# Patient Record
Sex: Male | Born: 1935 | Race: White | Hispanic: No | Marital: Married | Smoking: Former smoker
Health system: Southern US, Community
[De-identification: ages and names within clinical notes are randomized; demographics above are authoritative.]

## PROBLEM LIST (undated history)

## (undated) DIAGNOSIS — E7800 Pure hypercholesterolemia, unspecified: Secondary | ICD-10-CM

## (undated) DIAGNOSIS — G4700 Insomnia, unspecified: Secondary | ICD-10-CM

## (undated) DIAGNOSIS — N400 Enlarged prostate without lower urinary tract symptoms: Secondary | ICD-10-CM

## (undated) DIAGNOSIS — J329 Chronic sinusitis, unspecified: Secondary | ICD-10-CM

## (undated) DIAGNOSIS — M5030 Other cervical disc degeneration, unspecified cervical region: Secondary | ICD-10-CM

## (undated) DIAGNOSIS — I2510 Atherosclerotic heart disease of native coronary artery without angina pectoris: Secondary | ICD-10-CM

## (undated) DIAGNOSIS — J3800 Paralysis of vocal cords and larynx, unspecified: Secondary | ICD-10-CM

## (undated) DIAGNOSIS — J449 Chronic obstructive pulmonary disease, unspecified: Secondary | ICD-10-CM

## (undated) HISTORY — DX: Chronic sinusitis, unspecified: J32.9

## (undated) HISTORY — DX: Pure hypercholesterolemia, unspecified: E78.00

## (undated) HISTORY — DX: Insomnia, unspecified: G47.00

## (undated) HISTORY — DX: Benign prostatic hyperplasia without lower urinary tract symptoms: N40.0

## (undated) HISTORY — DX: Other cervical disc degeneration, unspecified cervical region: M50.30

## (undated) HISTORY — DX: Atherosclerotic heart disease of native coronary artery without angina pectoris: I25.10

## (undated) HISTORY — DX: Chronic obstructive pulmonary disease, unspecified: J44.9

## (undated) HISTORY — DX: Paralysis of vocal cords and larynx, unspecified: J38.00

---

## 2007-03-16 ENCOUNTER — Ambulatory Visit: Payer: Self-pay | Admitting: Surgery

## 2008-03-07 ENCOUNTER — Ambulatory Visit: Payer: Self-pay | Admitting: Surgery

## 2008-03-07 ENCOUNTER — Encounter: Admission: RE | Admit: 2008-03-07 | Discharge: 2008-03-07 | Payer: Self-pay | Admitting: Surgery

## 2009-02-27 ENCOUNTER — Ambulatory Visit: Payer: Self-pay | Admitting: Surgery

## 2009-02-27 ENCOUNTER — Encounter: Admission: RE | Admit: 2009-02-27 | Discharge: 2009-02-27 | Payer: Self-pay | Admitting: Surgery

## 2011-02-13 ENCOUNTER — Other Ambulatory Visit: Payer: Self-pay | Admitting: Surgery

## 2011-02-13 DIAGNOSIS — I712 Thoracic aortic aneurysm, without rupture: Secondary | ICD-10-CM

## 2011-02-25 ENCOUNTER — Encounter (INDEPENDENT_AMBULATORY_CARE_PROVIDER_SITE_OTHER): Payer: Medicare Other | Admitting: Surgery

## 2011-02-25 ENCOUNTER — Ambulatory Visit
Admission: RE | Admit: 2011-02-25 | Discharge: 2011-02-25 | Disposition: A | Discharge status: Home / Self Care | Payer: Medicare Other | Source: Ambulatory Visit | Attending: Surgery | Admitting: Surgery

## 2011-02-25 DIAGNOSIS — I712 Thoracic aortic aneurysm, without rupture: Secondary | ICD-10-CM

## 2011-02-25 MED ORDER — GADOBENATE DIMEGLUMINE 529 MG/ML IV SOLN
13.0000 mL | Freq: Once | INTRAVENOUS | Status: AC | PRN
  Administered 2011-02-25: 13 mL via INTRAVENOUS

## 2011-02-27 NOTE — Assessment & Plan Note (Signed)
OFFICE VISIT  David Figueroa, David Figueroa DOB:  [birth year]                                        February 27, 2011 CHART #:  [id number]  The patient returned to my office for followup of an ascending aortic aneurysm.  I last saw him on February 27, 2009, at which he had a stable a fusiform ascending aortic aneurysm measuring 4.1 x 4.1 cm on CT scan. We decided to follow this up in 2 years.  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  He was told that there really was not much to do.  He said they did do an echocardiogram to rule out intracardiac thrombus.  PHYSICAL EXAMINATION:  Vital Signs:  Today, his blood pressure is 176/106 and a repeat was 168/104, pulse was 87 and regular, respiratory rate is 20 and unlabored.  Oxygen saturation in room air is 97%. General:  He looks well.  Cardiac:  Regular rate and rhythm with normal heart sounds.  There is no murmur, rub, or gallop.  Lungs:  Clear.  MR angiogram of the chest shows a stable 4.2-cm fusiform ascending aortic aneurysm that is unchanged from previous chest CT scan on February 27, 2009.  There was no evidence of aortic dissection.  There was mild atherosclerotic plaque in the descending thoracic aorta but no evidence of aneurysm.  There is a stable 2.3-cm thyroid nodule within the isthmus.  There were no other abnormalities seen.  IMPRESSION:  The patient has a stable 4.2-cm ascending aortic aneurysm. Given his age of 89, I think it is unlikely that he will require intervention for this aneurysm in the future.  I gave him the option of continuing to follow this with periodic MRI or to stop following it and he would like to continue following it for the time being.  I will plan to see him back in about 2 years for repeat MR angiogram of the thoracic  aorta.  Evelene Croon, M.D. Electronically Signed  BB/MEDQ  D:  02/27/2011  T:  02/27/2011  Job:  [id number]  cc:   Roney Marion, MD

## 2011-05-06 NOTE — Assessment & Plan Note (Signed)
OFFICE VISIT   FABIO, WAH  DOB:  04/18/1928                                        February 27, 2009  CHART #:  [id number]   The patient returns today for followup of an ascending aortic aneurysm.  I last saw him on March 07, 2008, at which time, CT scan of the chest  showed that it was still 4.1 x 4.1 cm and had not changed since his  initial CT scan in March 2008.  Over the past year, he has had no chest  pain or pressure.  His only complaints are related to his vision as well  as some difficulty with balance and decreased energy level.   PHYSICAL EXAMINATION:  VITAL SIGNS:  Today, his blood pressure 123/83,  his pulse is 100 and regular, respiratory rate is 18 and unlabored.  Oxygen saturation on room air is 96%.  GENERAL:  He looks well.  CARDIAC:  A regular rate and rhythm with a soft 1/6 high-pitched murmur  over the aorta.  There is no diastolic murmur.  LUNGS:  Clear.  EXTREMITIES:  There is no peripheral edema.   Followup CT scan of the chest today shows no change in the size of the  ascending aortic aneurysm measuring 4.1 x 4.1 cm.  There are no other  significant abnormalities on the CT scan.   IMPRESSION:  The patient has a 4.1-cm ascending aortic aneurysm that has  been stable since at least March 2008.  I told him that I think this is  unlikely to change significantly over the next  year and we could wait 2 years to do a MR angiogram.  He will not  receive any further radiation for that study.  I will plan to see him  back after that.   Evelene Croon, M.D.  Electronically Signed   BB/MEDQ  D:  02/27/2009  T:  02/27/2009  Job:  [id number]   cc:   Barbera Setters. Lawana Pai, MD

## 2011-05-06 NOTE — Assessment & Plan Note (Signed)
OFFICE VISIT   David Figueroa, David Figueroa  DOB:  08-28-1928                                        March 07, 2008  CHART #:  [id number]   HISTORY:  [name] returned to my office today for followup of an  ascending aortic aneurysm.  He is a 77 year old gentleman referred by  Dr. Lawana Pai.  I last saw him on March 16, 2007 for evaluation of his  aneurysm.  That was his initial CT scan of the chest which had been done  to rule out pulmonary embolism after he presented with some symptoms of  congestion, chest tightness and weakness.  At that time it did show a 4  x 4.1 mm ascending aortic aneurysm with the descending aorta measuring  2.5 cm.  I felt that the best treatment would be to do a followup scan  in about six months to reassess the size.  He said that he did have a  repeat scan performed on September 29, 2007 prior to clearance for some  shoulder surgery but did not return to my office.  Today, he reports  feeling poorly over the past day or so.  He said he felt like he was  getting the flu, although he did have the flu shot.  He has had some  chills and cough and his wife said he felt hot this morning, although  she said she took his temperature and it was normal.  He does report  some mild achiness as well as some pains in the right side of his back.   PHYSICAL EXAMINATION:  VITAL SIGNS:  His blood pressure is 115/71, his  pulse is 104 and regular.  Respiratory rate is 18 and unlabored.  Oxygen  saturation is 96%.  GENERAL:  He looks tired.  CARDIAC:  Exam shows a regular rate and rhythm with normal S1 and S2.  There is no murmur.  LUNGS:  His lungs sound clear.   CT scan of the chest done today shows no change in the size of his  ascending aortic aneurysm measuring 4.1 x 4.1 cm in maximum diameter.  The scan did show a right lower lobe infiltrate consistent with  developing pneumonia.   IMPRESSION AND PLANS:  The ascending aortic aneurysm is unchanged.   I  would recommend repeating his CT scan in one year.  He does have  evidence of right lower lobe pneumonia on CT scan of the chest and has  been feeling poorly over the past day or two.  I asked him to contact  Dr. Ardine Bjork office today so that he can be seen and treated for  possible pneumonia.  I called Dr. Lawana Pai on the phone and discussed this  with him and he said that he would be happy to see the patient later  today.  I will plan to see him back in one year.   Evelene Croon, M.D.  Electronically Signed   BB/MEDQ  D:  03/07/2008  T:  03/08/2008  Job:  [id number]   cc:   Dr. Roney Marion

## 2013-01-31 ENCOUNTER — Other Ambulatory Visit: Payer: Self-pay

## 2013-01-31 DIAGNOSIS — I712 Thoracic aortic aneurysm, without rupture: Secondary | ICD-10-CM

## 2013-03-14 ENCOUNTER — Other Ambulatory Visit: Payer: Medicare Other

## 2013-03-15 ENCOUNTER — Ambulatory Visit: Payer: Medicare Other | Admitting: Surgery

## 2013-03-16 ENCOUNTER — Ambulatory Visit: Payer: Medicare Other | Admitting: Surgery

## 2013-03-30 ENCOUNTER — Ambulatory Visit
Admission: RE | Admit: 2013-03-30 | Discharge: 2013-03-30 | Disposition: A | Discharge status: Home / Self Care | Payer: Medicare Other | Source: Ambulatory Visit | Attending: Surgery | Admitting: Surgery

## 2013-03-30 ENCOUNTER — Encounter: Payer: Self-pay | Admitting: *Deleted

## 2013-03-30 ENCOUNTER — Ambulatory Visit (INDEPENDENT_AMBULATORY_CARE_PROVIDER_SITE_OTHER): Payer: Medicare Other | Admitting: Surgery

## 2013-03-30 ENCOUNTER — Encounter: Payer: Self-pay | Admitting: Surgery

## 2013-03-30 VITALS — BP 156/96 | HR 83 | Resp 16 | Ht 71.0 in | Wt 162.0 lb

## 2013-03-30 DIAGNOSIS — Z712 Person consulting for explanation of examination or test findings: Secondary | ICD-10-CM

## 2013-03-30 DIAGNOSIS — I712 Thoracic aortic aneurysm, without rupture, unspecified: Secondary | ICD-10-CM

## 2013-03-30 DIAGNOSIS — Z7189 Other specified counseling: Secondary | ICD-10-CM

## 2013-03-30 HISTORY — DX: Thoracic aortic aneurysm, without rupture: I71.2

## 2013-03-30 HISTORY — DX: Thoracic aortic aneurysm, without rupture, unspecified: I71.20

## 2013-03-30 MED ORDER — GADOBENATE DIMEGLUMINE 529 MG/ML IV SOLN
15.0000 mL | Freq: Once | INTRAVENOUS | Status: AC | PRN
  Administered 2013-03-30: 15 mL via INTRAVENOUS

## 2013-03-30 NOTE — Progress Notes (Signed)
[address]       [place] [address]             [phone]      HPI:     The patient returns today for followup of an ascending aortic aneurysm.  I last saw him in 2012, at which time, CT scan of the chest  showed that it was still 4.2 cm and had not changed since his  initial CT scan in March 2008.  Over the past year, he has had no chest  pain or pressure.  His only complaints are related to his vision as well  as hearing loss.  BP 156/96  Pulse 83  Resp 16  Ht 5\' 11"  (1.803 m)  Wt 162 lb (73.483 kg)  BMI 22.6 kg/m2  SpO2 95%   GENERAL:  He looks well.  CARDIAC:  A regular rate and rhythm with a soft 1/6 high-pitched murmur  over the aorta.  There is no diastolic murmur.  LUNGS:  Clear.  EXTREMITIES:  There is no peripheral edema.      Current Outpatient Prescriptions  Medication Sig Dispense Refill  . aspirin 325 MG EC tablet Take 325 mg by mouth daily.      . citalopram (CELEXA) 10 MG tablet Take 10 mg by mouth daily.      Marland Kitchen dutasteride (AVODART) 0.5 MG capsule Take 0.5 mg by mouth daily.      . Fluticasone-Salmeterol (ADVAIR) 100-50 MCG/DOSE AEPB Inhale 1 puff into the lungs every 12 (twelve) hours.      . hydrocortisone (ANUSOL-HC) 2.5 % rectal cream Place rectally as needed for hemorrhoids.      . Multiple Vitamins-Minerals (MULTIVITAMIN PO) Take by mouth.      . oxazepam (SERAX) 15 MG capsule Take 15 mg by mouth at bedtime as needed for sleep or anxiety. One or two as needed hs      . pravastatin (PRAVACHOL) 40 MG tablet Take 40 mg by mouth daily.      Marland Kitchen saccharomyces boulardii (FLORASTOR) 250 MG capsule Take 250 mg by mouth 2 (two) times daily.      . vitamin E 400 UNIT capsule Take 400 Units by mouth daily.       No current facility-administered medications for this visit.    Diagnostic Tests:   *RADIOLOGY REPORT*   Clinical Data: Aortic aneurysm   MRA CHEST WITH OR WITHOUT CONTRAST   Technique: Angiographic images of the  chest were obtained using MRA technique without and with intravenous contrast.   Contrast: 15mL MULTIHANCE GADOBENATE DIMEGLUMINE 529 MG/ML IV SOLN   Comparison: 02/25/2011   Findings: Maximal diameters of the aorta at the sinus of Valsalva, sinotubular junction, and ascending aorta are 4.2 cm, 3.2 cm, and 4.2 cm respectively. No evidence of intramural hematoma or dissection.   The aorta is patent.  Great vessels are patent.  Right subclavian and common carotid arteries are patent.  Right vertebral artery is patent.  Left vertebral artery is very diminutive and grossly patent.  Visualized internal jugular veins are patent.   No abnormal mediastinal mass effect or pericardial effusion. Dependent atelectasis at the right lung base.  No pleural effusion.   Thyroid gland remains heterogeneous.   IMPRESSION: Stable mild aneurysmal dilatation of the ascending aorta and sinus of Valsalva.     Original Report Authenticated By: Jolaine Click, M.D.          Impression:  He has stable fusiform aneurysmal dilatation of  the ascending aorta with a maximum diameter of 4.2 cm. This has not changed significantly since his initial CT scan in 2008.  Plan:  He is 79 years old but in overall fairly good shape for his age so I have recommended doing a followup scan in about 2 years.

## 2013-07-18 DIAGNOSIS — H3560 Retinal hemorrhage, unspecified eye: Secondary | ICD-10-CM | POA: Insufficient documentation

## 2013-07-18 HISTORY — DX: Retinal hemorrhage, unspecified eye: H35.60

## 2014-03-09 ENCOUNTER — Other Ambulatory Visit: Payer: Self-pay

## 2014-04-19 ENCOUNTER — Ambulatory Visit: Payer: Medicare Other | Admitting: Surgery

## 2015-03-07 ENCOUNTER — Other Ambulatory Visit: Payer: Self-pay | Admitting: *Deleted

## 2015-03-07 DIAGNOSIS — I712 Thoracic aortic aneurysm, without rupture, unspecified: Secondary | ICD-10-CM

## 2015-04-04 ENCOUNTER — Ambulatory Visit (INDEPENDENT_AMBULATORY_CARE_PROVIDER_SITE_OTHER): Payer: Commercial Managed Care - HMO | Admitting: Surgery

## 2015-04-04 ENCOUNTER — Encounter: Payer: Self-pay | Admitting: Surgery

## 2015-04-04 ENCOUNTER — Ambulatory Visit
Admission: RE | Admit: 2015-04-04 | Discharge: 2015-04-04 | Disposition: A | Discharge status: Home / Self Care | Payer: Commercial Managed Care - HMO | Source: Ambulatory Visit | Attending: Surgery | Admitting: Surgery

## 2015-04-04 DIAGNOSIS — I712 Thoracic aortic aneurysm, without rupture, unspecified: Secondary | ICD-10-CM

## 2015-04-04 DIAGNOSIS — I7121 Aneurysm of the ascending aorta, without rupture: Secondary | ICD-10-CM

## 2015-04-04 MED ORDER — GADOBENATE DIMEGLUMINE 529 MG/ML IV SOLN
15.0000 mL | Freq: Once | INTRAVENOUS | Status: AC | PRN
  Administered 2015-04-04: 15 mL via INTRAVENOUS

## 2015-04-07 ENCOUNTER — Encounter: Payer: Self-pay | Admitting: Surgery

## 2015-04-07 NOTE — Progress Notes (Signed)
HPI:  The patient returns today for follow up of an ascending aortic aneurysm. I last saw him on 03/30/2013 and his MRA of the chest had not changed since 2008 showing a maximum diameter of 4.2 cm in the mid ascending aorta. He has been feeling well with no new medical problems. He denies chest or back pain.  Current Outpatient Prescriptions  Medication Sig Dispense Refill  . aspirin 325 MG EC tablet Take 325 mg by mouth daily.    . citalopram (CELEXA) 10 MG tablet Take 10 mg by mouth daily.    Marland Kitchen dutasteride (AVODART) 0.5 MG capsule Take 0.5 mg by mouth daily.    . Fluticasone-Salmeterol (ADVAIR) 100-50 MCG/DOSE AEPB Inhale 1 puff into the lungs every 12 (twelve) hours.    . hydrocortisone (ANUSOL-HC) 2.5 % rectal cream Place rectally as needed for hemorrhoids.    . Multiple Vitamins-Minerals (MULTIVITAMIN PO) Take by mouth.    . oxazepam (SERAX) 15 MG capsule Take 15 mg by mouth at bedtime as needed for sleep or anxiety. One or two as needed hs    . pravastatin (PRAVACHOL) 40 MG tablet Take 40 mg by mouth daily.    . vitamin E 400 UNIT capsule Take 400 Units by mouth daily.     No current facility-administered medications for this visit.     Physical Exam: BP 155/90 mmHg  Pulse 90  Resp 20  Ht 5\' 11"  (1.803 m)  Wt 156 lb (70.761 kg)  BMI 21.77 kg/m2  SpO2 95% He looks well Cardiac exam shows a regular rate and rhythm with a 1/6 murmur along the RSB. There is no diastolic murmur. Lungs are clear There is no peripheral edema  Diagnostic Tests:  CLINICAL DATA: 81 year old male with thoracic aortic aneurysm. Scheduled to year follow-up evaluation.  EXAM: MRA CHEST WITH OR WITHOUT CONTRAST  TECHNIQUE: Angiographic images of the chest were obtained using MRA technique without and with intravenous contrast.  CONTRAST: 6mL MULTIHANCE GADOBENATE DIMEGLUMINE 529 MG/ML IV SOLN  COMPARISON: Prior MRA chest 03/30/2013  FINDINGS: VASCULAR  Stable mild fusiform  aneurysmal dilatation of the tubular portion of the ascending thoracic aorta. The maximal transverse diameter is again noted at 4.2 cm. The aortic root also measures 4.2 cm at the sinuses of Valsalva. There is no evidence of effacement of the sino-tubular junction. No evidence of significant atherosclerotic plaque, stenosis or dissection. The visualized portions of the branch arteries demonstrate no evidence of significant stenosis. There is mild-to-moderate narrowing 1 cm beyond the origin of the left subclavian artery. Normal caliber pulmonary artery. No evidence of pulmonary embolus. The heart is within normal limits for size. No pericardial effusion.  NON VASCULAR  No focal signal abnormality or abnormal enhancement. The lungs are clear save for trace dependent atelectasis in the right lower lobe. Unremarkable esophagus, mediastinum and bony structures.  IMPRESSION: 1. Stable mild dilatation of the aortic root and tubular portion of the ascending thoracic aorta with a maximal diameter of 4.2 cm. No evidence of effacement of the sino-tubular junction. 2. 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  Signed,  Criselda Peaches, MD  [facility]  [facility]   Electronically Signed  By: Jacqulynn Cadet M.D.  On: 04/04/2015 14:16  Impression:  He has a stable 4.2 cm fusiform ascending aortic aneurysm that is unchanged since 2008. His blood pressure is a little high today but he says that he thinks  that is due to coming to the doctor. He checks it at home and it is usually lower. I have recommended checking the aneurysm again in 2 years to be sure that it does not significantly change since he is in good condition for his age and may be an operative candidate if there was a large change in size.  Plan:  I will see him in 2 years with an MRA of the  chest.   Gaye Pollack, MD [facility] [phone]

## 2015-06-04 ENCOUNTER — Encounter (INDEPENDENT_AMBULATORY_CARE_PROVIDER_SITE_OTHER): Payer: Commercial Managed Care - HMO | Admitting: Ophthalmology

## 2015-06-04 DIAGNOSIS — H34233 Retinal artery branch occlusion, bilateral: Secondary | ICD-10-CM | POA: Diagnosis not present

## 2015-06-04 DIAGNOSIS — H43813 Vitreous degeneration, bilateral: Secondary | ICD-10-CM

## 2015-06-04 DIAGNOSIS — H3531 Nonexudative age-related macular degeneration: Secondary | ICD-10-CM | POA: Diagnosis not present

## 2015-06-04 DIAGNOSIS — H2512 Age-related nuclear cataract, left eye: Secondary | ICD-10-CM | POA: Diagnosis not present

## 2017-01-05 DIAGNOSIS — H26491 Other secondary cataract, right eye: Secondary | ICD-10-CM | POA: Diagnosis not present

## 2017-01-05 DIAGNOSIS — H2512 Age-related nuclear cataract, left eye: Secondary | ICD-10-CM | POA: Diagnosis not present

## 2017-01-05 DIAGNOSIS — H35373 Puckering of macula, bilateral: Secondary | ICD-10-CM | POA: Diagnosis not present

## 2017-01-05 DIAGNOSIS — H34233 Retinal artery branch occlusion, bilateral: Secondary | ICD-10-CM | POA: Diagnosis not present

## 2017-03-03 ENCOUNTER — Other Ambulatory Visit: Payer: Self-pay | Admitting: *Deleted

## 2017-03-03 DIAGNOSIS — I712 Thoracic aortic aneurysm, without rupture, unspecified: Secondary | ICD-10-CM

## 2017-03-14 ENCOUNTER — Other Ambulatory Visit: Payer: Commercial Managed Care - HMO

## 2017-03-31 ENCOUNTER — Other Ambulatory Visit: Payer: Self-pay | Admitting: Surgery

## 2017-04-08 ENCOUNTER — Ambulatory Visit
Admission: RE | Admit: 2017-04-08 | Discharge: 2017-04-08 | Disposition: A | Discharge status: Home / Self Care | Payer: Medicare HMO | Source: Ambulatory Visit | Attending: Surgery | Admitting: Surgery

## 2017-04-08 ENCOUNTER — Encounter: Payer: Self-pay | Admitting: Surgery

## 2017-04-08 ENCOUNTER — Ambulatory Visit (INDEPENDENT_AMBULATORY_CARE_PROVIDER_SITE_OTHER): Payer: Medicare HMO | Admitting: Surgery

## 2017-04-08 VITALS — BP 148/88 | HR 88 | Resp 20 | Ht 71.0 in

## 2017-04-08 DIAGNOSIS — I712 Thoracic aortic aneurysm, without rupture, unspecified: Secondary | ICD-10-CM

## 2017-04-08 DIAGNOSIS — Z8679 Personal history of other diseases of the circulatory system: Secondary | ICD-10-CM | POA: Diagnosis not present

## 2017-04-08 MED ORDER — GADOBENATE DIMEGLUMINE 529 MG/ML IV SOLN
15.0000 mL | Freq: Once | INTRAVENOUS | Status: AC | PRN
  Administered 2017-04-08: 15 mL via INTRAVENOUS

## 2017-04-08 NOTE — Progress Notes (Signed)
HPI:  The patient returns today for follow up of an ascending aortic aneurysm. I last saw him on 04/07/2015 and his MRA of the chest had not changed since 2008 showing a maximum diameter of 4.2 cm in the mid ascending aorta. He reports having a couple strokes since I last saw him that have affected his vision.  He denies chest or back pain.  Current Outpatient Prescriptions  Medication Sig Dispense Refill  . aspirin 325 MG EC tablet Take 325 mg by mouth daily.    . citalopram (CELEXA) 10 MG tablet Take 10 mg by mouth daily.    . clopidogrel (PLAVIX) 75 MG tablet Take 75 mg by mouth daily.    Marland Kitchen dutasteride (AVODART) 0.5 MG capsule Take 0.5 mg by mouth daily.    . Fluticasone-Salmeterol (ADVAIR) 100-50 MCG/DOSE AEPB Inhale 1 puff into the lungs every 12 (twelve) hours.    . hydrocortisone (ANUSOL-HC) 2.5 % rectal cream Place rectally as needed for hemorrhoids.    . Multiple Vitamins-Minerals (MULTIVITAMIN PO) Take by mouth.    . oxazepam (SERAX) 15 MG capsule Take 15 mg by mouth at bedtime as needed for sleep or anxiety. One or two as needed hs    . pravastatin (PRAVACHOL) 40 MG tablet Take 40 mg by mouth daily.    . vitamin E 400 UNIT capsule Take 400 Units by mouth daily.     No current facility-administered medications for this visit.      Physical Exam:  BP (!) 148/88   Pulse 88   Resp 20   Ht 5\' 11"  (1.803 m)   SpO2 99% Comment: RA He looks well Cardiac exam shows a regular rate and rhythm with a 1/6 systolic murmur along the RSB. There is no diastolic murmur. Lungs are clear There is no peripheral edema   Diagnostic Tests:  CLINICAL DATA:  83 year old male with a history of thoracic aortic aneurysm  EXAM: MRA CHEST WITH OR WITHOUT CONTRAST  TECHNIQUE: Angiographic images of the chest were obtained using MRA technique without and with intravenous contrast.  CONTRAST:  72mL MULTIHANCE GADOBENATE DIMEGLUMINE 529 MG/ML IV SOLN  COMPARISON:  Multiple prior  study, most recent 04/04/2015, 03/30/2013, 02/25/2011, CT 03/27/2010  FINDINGS: VASCULAR  Diameter of the ascending aorta measured perpendicular to the flow channel, measures approximately 4.2 cm, relatively unchanged from the comparison MR. no dissection flap. No periaortic fluid.  Greatest diameter measured at the sino-tubular junction is 3.1 cm, measured on series 13. Diameter estimated at the annulus 2.7 cm (series 8).  Three vessel arch, with patency maintained of the branch vessels. Again, evidence of perhaps 50% narrowing of the proximal left subclavian artery.  Heart size unchanged with no pericardial fluid/thickening.  NON-VASCULAR  Chest wall unremarkable. Visualized lungs demonstrate no pathologic signal.  Clear central airways. No mediastinal adenopathy. Unremarkable appearance of the esophagus. Hiatal hernia partially visualized. Unremarkable appearance of the visualized spinal canal.  IMPRESSION: Re- demonstration of ascending aortic aneurysm which measures 4.2 cm on today's study, unchanged from the comparison.  Again the MRI demonstrates approximately 50% narrowing of the left subclavian artery just after the origin.  Signed,  Dulcy Fanny. Earleen Newport, DO  [facility]  [facility]   Electronically Signed   By: Corrie Mckusick D.O.   On: 04/08/2017 16:05   Impression:  He has a stable 4.2 cm fusiform ascending aortic aneurysm that is unchanged since 2008. He is 83 years old and this has been stable  for the past 10 years so I don't think it is necessary to continue doing CT or MR studies. I think the chance of this small aneurysm significantly enlarging to require surgery in his lifetime is very small. I personally reviewed and interpreted the MRA images and reviewed them with him and his wife. I discussed the importance of good BP control. All of their questions have been answered.  Plan:  He  will continue to follow up with Dr. Nicki Reaper. I will be happy to see him back if the need arises but have not scheduled any further imaging studies.    Gaye Pollack, MD [facility] [phone]

## 2017-04-14 DIAGNOSIS — Z6821 Body mass index (BMI) 21.0-21.9, adult: Secondary | ICD-10-CM | POA: Diagnosis not present

## 2017-04-14 DIAGNOSIS — K219 Gastro-esophageal reflux disease without esophagitis: Secondary | ICD-10-CM | POA: Diagnosis not present

## 2017-04-14 DIAGNOSIS — Z0000 Encounter for general adult medical examination without abnormal findings: Secondary | ICD-10-CM | POA: Diagnosis not present

## 2017-04-14 DIAGNOSIS — E7800 Pure hypercholesterolemia, unspecified: Secondary | ICD-10-CM | POA: Diagnosis not present

## 2017-04-14 DIAGNOSIS — Z79899 Other long term (current) drug therapy: Secondary | ICD-10-CM | POA: Diagnosis not present

## 2017-04-14 DIAGNOSIS — R5383 Other fatigue: Secondary | ICD-10-CM | POA: Diagnosis not present

## 2017-04-14 DIAGNOSIS — E559 Vitamin D deficiency, unspecified: Secondary | ICD-10-CM | POA: Diagnosis not present

## 2017-04-14 DIAGNOSIS — Z8673 Personal history of transient ischemic attack (TIA), and cerebral infarction without residual deficits: Secondary | ICD-10-CM | POA: Diagnosis not present

## 2017-04-14 DIAGNOSIS — E785 Hyperlipidemia, unspecified: Secondary | ICD-10-CM | POA: Diagnosis not present

## 2017-04-14 DIAGNOSIS — J40 Bronchitis, not specified as acute or chronic: Secondary | ICD-10-CM | POA: Diagnosis not present

## 2017-04-16 DIAGNOSIS — K219 Gastro-esophageal reflux disease without esophagitis: Secondary | ICD-10-CM | POA: Diagnosis not present

## 2017-04-16 DIAGNOSIS — G3184 Mild cognitive impairment, so stated: Secondary | ICD-10-CM | POA: Diagnosis not present

## 2017-04-16 DIAGNOSIS — K649 Unspecified hemorrhoids: Secondary | ICD-10-CM | POA: Diagnosis not present

## 2017-04-16 DIAGNOSIS — Z0000 Encounter for general adult medical examination without abnormal findings: Secondary | ICD-10-CM | POA: Diagnosis not present

## 2017-04-16 DIAGNOSIS — J449 Chronic obstructive pulmonary disease, unspecified: Secondary | ICD-10-CM | POA: Diagnosis not present

## 2017-04-16 DIAGNOSIS — Z79899 Other long term (current) drug therapy: Secondary | ICD-10-CM | POA: Diagnosis not present

## 2017-04-16 DIAGNOSIS — Z7951 Long term (current) use of inhaled steroids: Secondary | ICD-10-CM | POA: Diagnosis not present

## 2017-04-16 DIAGNOSIS — Z6820 Body mass index (BMI) 20.0-20.9, adult: Secondary | ICD-10-CM | POA: Diagnosis not present

## 2017-04-16 DIAGNOSIS — Z87891 Personal history of nicotine dependence: Secondary | ICD-10-CM | POA: Diagnosis not present

## 2017-04-16 DIAGNOSIS — E7800 Pure hypercholesterolemia, unspecified: Secondary | ICD-10-CM | POA: Diagnosis not present

## 2017-04-16 DIAGNOSIS — K588 Other irritable bowel syndrome: Secondary | ICD-10-CM | POA: Diagnosis not present

## 2017-04-16 DIAGNOSIS — Z7902 Long term (current) use of antithrombotics/antiplatelets: Secondary | ICD-10-CM | POA: Diagnosis not present

## 2017-04-16 DIAGNOSIS — Z8673 Personal history of transient ischemic attack (TIA), and cerebral infarction without residual deficits: Secondary | ICD-10-CM | POA: Diagnosis not present

## 2017-04-16 DIAGNOSIS — H938X9 Other specified disorders of ear, unspecified ear: Secondary | ICD-10-CM | POA: Diagnosis not present

## 2017-04-16 DIAGNOSIS — I10 Essential (primary) hypertension: Secondary | ICD-10-CM | POA: Diagnosis not present

## 2017-04-16 DIAGNOSIS — Z9181 History of falling: Secondary | ICD-10-CM | POA: Diagnosis not present

## 2017-04-16 DIAGNOSIS — G4700 Insomnia, unspecified: Secondary | ICD-10-CM | POA: Diagnosis not present

## 2017-04-22 DIAGNOSIS — L219 Seborrheic dermatitis, unspecified: Secondary | ICD-10-CM | POA: Diagnosis not present

## 2017-04-22 DIAGNOSIS — S81802A Unspecified open wound, left lower leg, initial encounter: Secondary | ICD-10-CM | POA: Diagnosis not present

## 2017-04-23 ENCOUNTER — Other Ambulatory Visit: Payer: Commercial Managed Care - HMO

## 2017-06-01 DIAGNOSIS — Z6821 Body mass index (BMI) 21.0-21.9, adult: Secondary | ICD-10-CM | POA: Diagnosis not present

## 2017-06-01 DIAGNOSIS — J209 Acute bronchitis, unspecified: Secondary | ICD-10-CM | POA: Diagnosis not present

## 2017-07-07 DIAGNOSIS — H35373 Puckering of macula, bilateral: Secondary | ICD-10-CM | POA: Diagnosis not present

## 2017-07-07 DIAGNOSIS — H34233 Retinal artery branch occlusion, bilateral: Secondary | ICD-10-CM | POA: Diagnosis not present

## 2017-10-21 DIAGNOSIS — R69 Illness, unspecified: Secondary | ICD-10-CM | POA: Diagnosis not present

## 2017-12-28 DIAGNOSIS — Z6820 Body mass index (BMI) 20.0-20.9, adult: Secondary | ICD-10-CM | POA: Diagnosis not present

## 2017-12-28 DIAGNOSIS — S2239XA Fracture of one rib, unspecified side, initial encounter for closed fracture: Secondary | ICD-10-CM | POA: Diagnosis not present

## 2018-01-06 DIAGNOSIS — H3411 Central retinal artery occlusion, right eye: Secondary | ICD-10-CM | POA: Diagnosis not present

## 2018-01-08 DIAGNOSIS — H3411 Central retinal artery occlusion, right eye: Secondary | ICD-10-CM | POA: Diagnosis not present

## 2018-01-14 DIAGNOSIS — I517 Cardiomegaly: Secondary | ICD-10-CM | POA: Diagnosis not present

## 2018-01-14 DIAGNOSIS — I6523 Occlusion and stenosis of bilateral carotid arteries: Secondary | ICD-10-CM | POA: Diagnosis not present

## 2018-01-14 DIAGNOSIS — H3411 Central retinal artery occlusion, right eye: Secondary | ICD-10-CM | POA: Diagnosis not present

## 2018-01-21 DIAGNOSIS — H538 Other visual disturbances: Secondary | ICD-10-CM | POA: Diagnosis not present

## 2018-01-21 DIAGNOSIS — H539 Unspecified visual disturbance: Secondary | ICD-10-CM | POA: Diagnosis not present

## 2018-01-25 DIAGNOSIS — J439 Emphysema, unspecified: Secondary | ICD-10-CM | POA: Diagnosis not present

## 2018-01-25 DIAGNOSIS — I700 Atherosclerosis of aorta: Secondary | ICD-10-CM | POA: Diagnosis not present

## 2018-01-25 DIAGNOSIS — G458 Other transient cerebral ischemic attacks and related syndromes: Secondary | ICD-10-CM | POA: Diagnosis not present

## 2018-01-25 DIAGNOSIS — I712 Thoracic aortic aneurysm, without rupture: Secondary | ICD-10-CM | POA: Diagnosis not present

## 2018-01-28 DIAGNOSIS — R002 Palpitations: Secondary | ICD-10-CM | POA: Diagnosis not present

## 2018-02-03 DIAGNOSIS — H40051 Ocular hypertension, right eye: Secondary | ICD-10-CM | POA: Diagnosis not present

## 2018-02-03 DIAGNOSIS — H4311 Vitreous hemorrhage, right eye: Secondary | ICD-10-CM | POA: Diagnosis not present

## 2018-02-03 DIAGNOSIS — H3411 Central retinal artery occlusion, right eye: Secondary | ICD-10-CM | POA: Diagnosis not present

## 2018-02-10 DIAGNOSIS — H40051 Ocular hypertension, right eye: Secondary | ICD-10-CM | POA: Diagnosis not present

## 2018-02-16 DIAGNOSIS — R0609 Other forms of dyspnea: Secondary | ICD-10-CM | POA: Diagnosis not present

## 2018-02-16 DIAGNOSIS — I472 Ventricular tachycardia: Secondary | ICD-10-CM | POA: Diagnosis not present

## 2018-02-17 DIAGNOSIS — R0609 Other forms of dyspnea: Secondary | ICD-10-CM | POA: Diagnosis not present

## 2018-02-18 DIAGNOSIS — E782 Mixed hyperlipidemia: Secondary | ICD-10-CM | POA: Insufficient documentation

## 2018-02-18 DIAGNOSIS — H4311 Vitreous hemorrhage, right eye: Secondary | ICD-10-CM | POA: Diagnosis not present

## 2018-02-18 DIAGNOSIS — Z8673 Personal history of transient ischemic attack (TIA), and cerebral infarction without residual deficits: Secondary | ICD-10-CM | POA: Insufficient documentation

## 2018-02-18 DIAGNOSIS — Z7982 Long term (current) use of aspirin: Secondary | ICD-10-CM

## 2018-02-18 HISTORY — DX: Mixed hyperlipidemia: E78.2

## 2018-02-18 HISTORY — DX: Long term (current) use of aspirin: Z79.82

## 2018-02-18 HISTORY — DX: Personal history of transient ischemic attack (TIA), and cerebral infarction without residual deficits: Z86.73

## 2018-02-19 DIAGNOSIS — R0600 Dyspnea, unspecified: Secondary | ICD-10-CM | POA: Insufficient documentation

## 2018-02-19 DIAGNOSIS — H3411 Central retinal artery occlusion, right eye: Secondary | ICD-10-CM | POA: Diagnosis not present

## 2018-02-19 DIAGNOSIS — Z789 Other specified health status: Secondary | ICD-10-CM

## 2018-02-19 DIAGNOSIS — I77819 Aortic ectasia, unspecified site: Secondary | ICD-10-CM | POA: Diagnosis not present

## 2018-02-19 DIAGNOSIS — J449 Chronic obstructive pulmonary disease, unspecified: Secondary | ICD-10-CM | POA: Diagnosis not present

## 2018-02-19 DIAGNOSIS — Z87891 Personal history of nicotine dependence: Secondary | ICD-10-CM | POA: Insufficient documentation

## 2018-02-19 DIAGNOSIS — R0609 Other forms of dyspnea: Secondary | ICD-10-CM

## 2018-02-19 DIAGNOSIS — Z7982 Long term (current) use of aspirin: Secondary | ICD-10-CM | POA: Diagnosis not present

## 2018-02-19 DIAGNOSIS — E782 Mixed hyperlipidemia: Secondary | ICD-10-CM | POA: Diagnosis not present

## 2018-02-19 DIAGNOSIS — I472 Ventricular tachycardia, unspecified: Secondary | ICD-10-CM | POA: Insufficient documentation

## 2018-02-19 DIAGNOSIS — I771 Stricture of artery: Secondary | ICD-10-CM | POA: Insufficient documentation

## 2018-02-19 DIAGNOSIS — R0602 Shortness of breath: Secondary | ICD-10-CM | POA: Diagnosis not present

## 2018-02-19 DIAGNOSIS — Z8673 Personal history of transient ischemic attack (TIA), and cerebral infarction without residual deficits: Secondary | ICD-10-CM | POA: Diagnosis not present

## 2018-02-19 HISTORY — DX: Ventricular tachycardia: I47.2

## 2018-02-19 HISTORY — DX: Dyspnea, unspecified: R06.00

## 2018-02-19 HISTORY — DX: Other specified health status: Z78.9

## 2018-02-19 HISTORY — DX: Stricture of artery: I77.1

## 2018-02-19 HISTORY — DX: Other forms of dyspnea: R06.09

## 2018-02-19 HISTORY — DX: Personal history of nicotine dependence: Z87.891

## 2018-02-19 HISTORY — DX: Ventricular tachycardia, unspecified: I47.20

## 2018-02-21 DIAGNOSIS — R001 Bradycardia, unspecified: Secondary | ICD-10-CM | POA: Diagnosis not present

## 2018-02-22 DIAGNOSIS — G458 Other transient cerebral ischemic attacks and related syndromes: Secondary | ICD-10-CM | POA: Diagnosis not present

## 2018-02-22 DIAGNOSIS — H547 Unspecified visual loss: Secondary | ICD-10-CM | POA: Diagnosis not present

## 2018-03-02 DIAGNOSIS — R0609 Other forms of dyspnea: Secondary | ICD-10-CM | POA: Diagnosis not present

## 2018-03-02 DIAGNOSIS — Z8673 Personal history of transient ischemic attack (TIA), and cerebral infarction without residual deficits: Secondary | ICD-10-CM | POA: Diagnosis not present

## 2018-03-02 DIAGNOSIS — E782 Mixed hyperlipidemia: Secondary | ICD-10-CM | POA: Diagnosis not present

## 2018-03-02 DIAGNOSIS — I771 Stricture of artery: Secondary | ICD-10-CM | POA: Diagnosis not present

## 2018-03-02 DIAGNOSIS — I472 Ventricular tachycardia: Secondary | ICD-10-CM | POA: Diagnosis not present

## 2018-03-02 DIAGNOSIS — Z789 Other specified health status: Secondary | ICD-10-CM | POA: Diagnosis not present

## 2018-03-02 DIAGNOSIS — I77819 Aortic ectasia, unspecified site: Secondary | ICD-10-CM | POA: Diagnosis not present

## 2018-03-02 DIAGNOSIS — Z7982 Long term (current) use of aspirin: Secondary | ICD-10-CM | POA: Diagnosis not present

## 2018-03-02 DIAGNOSIS — R0602 Shortness of breath: Secondary | ICD-10-CM | POA: Diagnosis not present

## 2018-03-02 DIAGNOSIS — Z87891 Personal history of nicotine dependence: Secondary | ICD-10-CM | POA: Diagnosis not present

## 2018-03-03 DIAGNOSIS — H3411 Central retinal artery occlusion, right eye: Secondary | ICD-10-CM | POA: Diagnosis not present

## 2018-03-11 ENCOUNTER — Encounter: Payer: Self-pay | Admitting: Pulmonary Disease

## 2018-03-11 ENCOUNTER — Ambulatory Visit: Payer: Medicare HMO | Admitting: Pulmonary Disease

## 2018-03-11 VITALS — BP 124/82 | HR 88 | Ht 70.0 in | Wt 140.0 lb

## 2018-03-11 DIAGNOSIS — J432 Centrilobular emphysema: Secondary | ICD-10-CM

## 2018-03-11 DIAGNOSIS — J9611 Chronic respiratory failure with hypoxia: Secondary | ICD-10-CM | POA: Insufficient documentation

## 2018-03-11 DIAGNOSIS — J449 Chronic obstructive pulmonary disease, unspecified: Secondary | ICD-10-CM

## 2018-03-11 DIAGNOSIS — J3800 Paralysis of vocal cords and larynx, unspecified: Secondary | ICD-10-CM

## 2018-03-11 HISTORY — DX: Chronic respiratory failure with hypoxia: J96.11

## 2018-03-11 MED ORDER — FLUTICASONE FUROATE-VILANTEROL 200-25 MCG/INH IN AEPB: 1.0000 | INHALATION_SPRAY | Freq: Every day | RESPIRATORY_TRACT | 0 refills | Status: DC

## 2018-03-11 MED ORDER — TIOTROPIUM BROMIDE MONOHYDRATE 2.5 MCG/ACT IN AERS: 2.0000 | INHALATION_SPRAY | Freq: Every day | RESPIRATORY_TRACT | 3 refills | Status: DC

## 2018-03-11 NOTE — Addendum Note (Signed)
Addended by: Valerie Salts on: 03/11/2018 04:15 PM   Modules accepted: Orders

## 2018-03-11 NOTE — Patient Instructions (Addendum)
Okay to stay off oxygen in the daytime, continue using it at night. Use oxygen daytime only if shortness of breath persists even after resting Check nocturnal oximetry.  Prescription for Spiriva 2 puffs once daily. Stop taking Advair, take Breo once daily instead - sample , call for prescription of this works.  Referral to pulmonary rehab at [facility]

## 2018-03-11 NOTE — Progress Notes (Signed)
Subjective:    Patient ID: David Figueroa, male    DOB: 05/02/1928, 84 y.o.   MRN: [id number]  HPI  84 year old ex-smoker presents for evaluation of COPD. He was evaluated in the PCP office about 2 weeks ago and was started on oxygen.  He needs advice on how to use this I reviewed PCP notes from 2/26 office visit and it seems that his oxygen saturation was 93%, walk test was done on a different day with details of this are not available to me at the time of this visit.  He reports increasing dyspnea on exertion for the past 4 years, he was diagnosed with COPD about 20 years ago, he especially reports shortness of breath when walking uphill or bending down.  He denies frequent chest colds or wheezing.  His wife Peter Congo corroborates a lot of his history since he has significant deafness. He has been maintained on Advair 100/50 for at least the last 2 years, Spiriva and albuterol MDI was added to his regimen last month.  His wife is specifically concerned about an episode 1 week ago and he had a staring spell for a few minutes and was unresponsive. He has been evaluated.  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  He smoked 1/2 pack/day for more than 60 years before he quit in 2011, more than 30 pack years  Also carries a history of vocal cord paralysis   Significant tests/ events reviewed CT angiogram chest 01/25/18 mild emphysema both upper lobes, ascending aortic aneurysm 4.2 cm noted on prior imaging  Spirometry showed severe airway obstruction with ratio of 49, FEV1 of 28% and FVC of 39% Ambulatory saturation -starting at 97% and stayed at 96% on 3 labs heart rate went up from 96-108  Past Medical History:  Diagnosis Date  . COPD (chronic obstructive pulmonary disease) ([place])   . DDD (degenerative disc disease), cervical   . Hypercholesterolemia   . Sinusitis   . Vocal cord paralysis     No past surgical history on file.  Allergies    Allergen Reactions  . Chantix [Varenicline Tartrate]     GI discomfort     Review of Systems Positive for shortness of breath with activity, nonproductive cough, loss of appetite, weight loss  Constitutional: negative for anorexia, fevers and sweats  Eyes: negative for irritation, redness and visual disturbance  Ears, nose, mouth, throat, and face: negative for earaches, epistaxis, nasal congestion and sore throat  Respiratory: negative for cough, sputum and wheezing  Cardiovascular: negative for chest pain, dyspnea, lower extremity edema, orthopnea, palpitations and syncope  Gastrointestinal: negative for abdominal pain, constipation, diarrhea, melena, nausea and vomiting  Genitourinary:negative for dysuria, frequency and hematuria  Hematologic/lymphatic: negative for bleeding, easy bruising and lymphadenopathy  Musculoskeletal:negative for arthralgias, muscle weakness and stiff joints  Neurological: negative for coordination problems, gait problems, headaches and weakness  Endocrine: negative for diabetic symptoms including polydipsia, polyuria and weight loss     Objective:   Physical Exam  Gen. Pleasant, elderly, well-nourished, in no distress, normal affect ENT - no lesions, no post nasal drip Neck: No JVD, no thyromegaly, no carotid bruits Lungs: no use of accessory muscles, no dullness to percussion, decreased  without rales or rhonchi  Cardiovascular: Rhythm regular, heart sounds  normal, no murmurs or gallops, no peripheral edema Abdomen: soft and non-tender, no hepatosplenomegaly, BS normal. Musculoskeletal: No deformities, no cyanosis or clubbing Neuro:  alert, non focal  Assessment & Plan:

## 2018-03-11 NOTE — Assessment & Plan Note (Signed)
Seems to have severe COPD,  Prescription for Spiriva 2 puffs once daily. Stop taking Advair, take Breo once daily instead - sample , call for prescription of this works.  Referral to pulmonary rehab at [facility]

## 2018-03-11 NOTE — Assessment & Plan Note (Signed)
Very close to requiring oxygen but saturations appear good today Okay to stay off oxygen in the daytime, continue using it at night. Use oxygen daytime only if shortness of breath persists even after resting Check nocturnal oximetry.

## 2018-03-12 ENCOUNTER — Encounter: Payer: Self-pay | Admitting: Pulmonary Disease

## 2018-03-16 NOTE — Telephone Encounter (Signed)
Error

## 2018-03-17 ENCOUNTER — Encounter: Payer: Self-pay | Admitting: Pulmonary Disease

## 2018-03-17 DIAGNOSIS — R0902 Hypoxemia: Secondary | ICD-10-CM | POA: Diagnosis not present

## 2018-03-22 ENCOUNTER — Telehealth: Payer: Self-pay | Admitting: Pulmonary Disease

## 2018-03-22 DIAGNOSIS — R0602 Shortness of breath: Secondary | ICD-10-CM | POA: Diagnosis not present

## 2018-03-22 DIAGNOSIS — J449 Chronic obstructive pulmonary disease, unspecified: Secondary | ICD-10-CM | POA: Diagnosis not present

## 2018-03-22 NOTE — Telephone Encounter (Signed)
ONO reviewed 02/2018, mild desaturation 24 minutes less than 88%. Okay to stop oxygen during sleep and see how he feels, report back in 1 month

## 2018-03-23 NOTE — Telephone Encounter (Signed)
Spoke with patient's wife Peter Congo. Advised her of RA's recs, she verbalized understanding. She has a pulse ox at home and will continue to monitor the patient.   I will place a reminder on patient's chart to follow up about the oxygen in May.   Nothing else needed at time of call.

## 2018-03-31 ENCOUNTER — Telehealth: Payer: Self-pay | Admitting: Pulmonary Disease

## 2018-03-31 NOTE — Telephone Encounter (Signed)
Called spoke with patient's spouse who reported confusion regarding pt's O2  Per the 4.1.19 phone note, pt had an ONO that was ordered at the last office visit and RA stated that patient can try off the O2 at bedtime and report back in 1 month.  However, she received a phone call from a [employer] (she does not recall the name) and they wanted to bring a home concentrator to their home.  Spouse reported the DME was not the company that they currently use [facility]).  It looks in patient's chart that the ONO order was sent to [facility] - perhaps they called?  [facility] but they close at [place] will look for number and call us back tomorrow or call back if they call her again  Will leave in triage to try calling [facility] in the morning

## 2018-04-01 DIAGNOSIS — Z87891 Personal history of nicotine dependence: Secondary | ICD-10-CM | POA: Diagnosis not present

## 2018-04-01 DIAGNOSIS — Z789 Other specified health status: Secondary | ICD-10-CM | POA: Diagnosis not present

## 2018-04-01 DIAGNOSIS — Z8673 Personal history of transient ischemic attack (TIA), and cerebral infarction without residual deficits: Secondary | ICD-10-CM | POA: Diagnosis not present

## 2018-04-01 DIAGNOSIS — I771 Stricture of artery: Secondary | ICD-10-CM | POA: Diagnosis not present

## 2018-04-01 DIAGNOSIS — E782 Mixed hyperlipidemia: Secondary | ICD-10-CM | POA: Diagnosis not present

## 2018-04-01 DIAGNOSIS — R0609 Other forms of dyspnea: Secondary | ICD-10-CM | POA: Diagnosis not present

## 2018-04-01 DIAGNOSIS — I77819 Aortic ectasia, unspecified site: Secondary | ICD-10-CM | POA: Diagnosis not present

## 2018-04-01 DIAGNOSIS — I472 Ventricular tachycardia: Secondary | ICD-10-CM | POA: Diagnosis not present

## 2018-04-01 DIAGNOSIS — Z7982 Long term (current) use of aspirin: Secondary | ICD-10-CM | POA: Diagnosis not present

## 2018-04-01 NOTE — Telephone Encounter (Signed)
Called [facility] in [place] to sort out confusion regarding the ONO that was done.  Caryl Ada at [facility] stated to me the order that was sent to them for the O2 was cancelled by Lindie Spruce at [facility] today, 04/01/18 due to pt already being with a DME and is having O2 provided by that company and is not wanting to switch DME's.  Caryl Ada also stated to me pt has been made aware.   Called and spoke with pt's wife Peter Congo making sure they were made aware of this and she stated to me she had received the phone call.  Stated to her for pt to continue off of O2 for a month and then we would reevaluate to see how pt felt.  Peter Congo expressed understanding. Nothing further needed at this time.

## 2018-04-15 DIAGNOSIS — H3411 Central retinal artery occlusion, right eye: Secondary | ICD-10-CM | POA: Diagnosis not present

## 2018-04-21 DIAGNOSIS — R0602 Shortness of breath: Secondary | ICD-10-CM | POA: Diagnosis not present

## 2018-04-21 DIAGNOSIS — J449 Chronic obstructive pulmonary disease, unspecified: Secondary | ICD-10-CM | POA: Diagnosis not present

## 2018-04-29 ENCOUNTER — Ambulatory Visit: Payer: Medicare HMO | Admitting: Pulmonary Disease

## 2018-04-29 ENCOUNTER — Encounter: Payer: Self-pay | Admitting: Pulmonary Disease

## 2018-04-29 DIAGNOSIS — J449 Chronic obstructive pulmonary disease, unspecified: Secondary | ICD-10-CM

## 2018-04-29 DIAGNOSIS — J9611 Chronic respiratory failure with hypoxia: Secondary | ICD-10-CM | POA: Diagnosis not present

## 2018-04-29 NOTE — Progress Notes (Signed)
   Subjective:    Patient ID: David Figueroa, male    DOB: Jan 06, 1928, 84 y.o.   MRN: [id number]  HPI 84 year old ex-smoker  for FU of COPD. He smoked 1/2 pack/day for more than 60 years before he quit in 2011, more than 30 pack years.  He is extremely hard of hearing  On his last visit, he was given a sample of Breo.  He prefers Advair and his wife prefers generic medications.  He has done well in terms of breathing, back to his baseline.  Oxygen saturation is 97% today and has been holding when he checks it at home. We reviewed nocturnal oximetry which shows minimal desaturation    He has been evaluated for Left subclavian stenosis by vascular and had a recent negative cardiac stress test. -history of vocal cord paralysis   Significant tests/ events reviewed CT angiogram chest 01/25/18 mild emphysema both upper lobes, ascending aortic aneurysm 4.2 cm noted on prior imaging  Spirometry 02/2018  severe airway obstruction with ratio of 49, FEV1 of 28% and FVC of 39% Ambulatory saturation -starting at 97% and stayed at 96% on 3 laps  ONO  02/2018, mild desaturation 24 minutes less than 88%.    Review of Systems Patient denies significant dyspnea,cough, hemoptysis,  chest pain, palpitations, pedal edema, orthopnea, paroxysmal nocturnal dyspnea, lightheadedness, nausea, vomiting, abdominal or  leg pains      Objective:   Physical Exam  Gen. 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

## 2018-04-29 NOTE — Assessment & Plan Note (Signed)
Refills on Advair and Spiriva will be sent to [facility] for 3 months.  We discussed signs and symptoms of an exacerbation-yellow/green sputum, wheezing, oxygen saturation being less than 88% persistently

## 2018-04-29 NOTE — Assessment & Plan Note (Signed)
Discontinue oxygen Mild desaturation during sleep, feel that oxygen will impose of boredom rather than benefit especially because he is high fall risk

## 2018-04-29 NOTE — Patient Instructions (Signed)
Refills on Advair and Spiriva will be sent to [facility] for 3 months.  Discontinue oxygen  We discussed signs and symptoms of an exacerbation-yellow/green sputum, wheezing, oxygen saturation being less than 88% persistently

## 2018-04-30 ENCOUNTER — Telehealth: Payer: Self-pay | Admitting: Pulmonary Disease

## 2018-04-30 DIAGNOSIS — J432 Centrilobular emphysema: Secondary | ICD-10-CM

## 2018-04-30 DIAGNOSIS — J9611 Chronic respiratory failure with hypoxia: Secondary | ICD-10-CM

## 2018-04-30 NOTE — Telephone Encounter (Signed)
Called and spoke with pt's spouse David Figueroa who stated pt was not needing O2 anymore and they were wanting the tanks to be picked up by [facility].  Placed order to [facility] stating for them to pick up pt's tanks.  Nothing further needed at this time.

## 2018-05-10 ENCOUNTER — Telehealth: Payer: Self-pay | Admitting: Pulmonary Disease

## 2018-05-10 MED ORDER — FLUTICASONE-SALMETEROL 100-50 MCG/DOSE IN AEPB: 1.0000 | INHALATION_SPRAY | Freq: Two times a day (BID) | RESPIRATORY_TRACT | 1 refills | Status: AC

## 2018-05-10 NOTE — Telephone Encounter (Signed)
Rx was refilled  Spoke with the pt and notified that this was done  Nothing further needed

## 2018-05-13 IMAGING — MR MR MRA CHEST W/ OR W/O CM
12 series · 16 of 16 positions shown · IV contrast (15 ML MULTIHANCE)
Comparison: Multiple prior study, most recent 04/04/2015,
03/30/2013, 02/25/2011, CT 03/27/2010

CLINICAL DATA: 88-year-old male with a history of thoracic aortic
aneurysm

EXAM:
MRA CHEST WITH OR WITHOUT CONTRAST
TECHNIQUE: Angiographic images of the chest were obtained using MRA technique
without and with intravenous contrast.
CONTRAST:  15mL MULTIHANCE GADOBENATE DIMEGLUMINE 529 MG/ML IV SOLN

[Series 4: bSSFP · axial · 6.0mm · 1.48mm/px · 1 of 31 slices shown (1 of 2)]
[im 1/31]
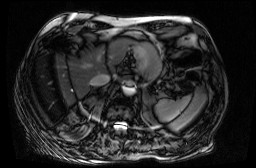

[Series 5: T2 · axial · 6.0mm · 1.48mm/px · 1 of 31 slices shown]
[im 1/31]
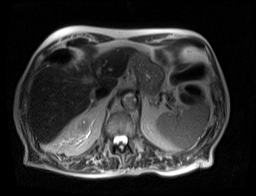

[Series 6: T1 · axial · 5.0mm · 1.48mm/px · 1 of 38 slices shown]
[im 1/38]
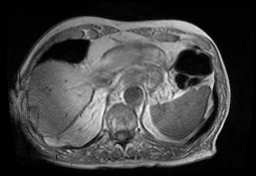

[Series 7: T1 dynamic · axial · non-contrast · 2.5mm · 0.74mm/px · z∈[-78,+140]mm · 2 of 88 slices shown]
[im 1/88]
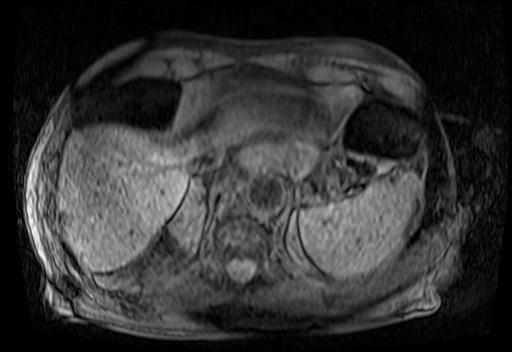
[im 88/88]
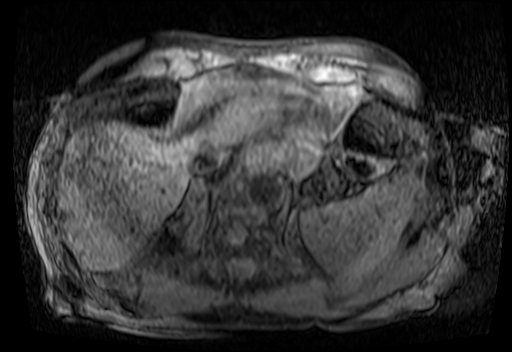

[Series 8: bSSFP · sagittal · 4.0mm · 1.64mm/px · 1 of 20 slices shown (2 of 2)]
[im 1/20]
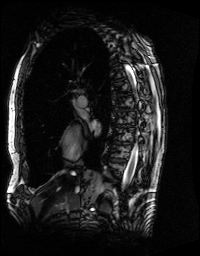

[Series 13: fl3d_cor_post_tt=1.0s_sub · sagittal · 1.5mm · 1.15mm/px · 1 of 79 slices shown]
[im 1/79]
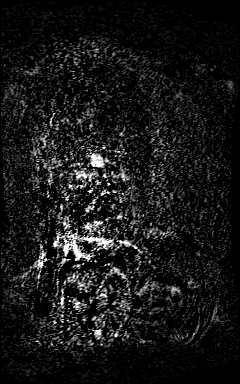

[Series 16: T1 fat-sat post-contrast · axial · 5.0mm · 1.48mm/px · 1 of 38 slices shown]
[im 1/38]
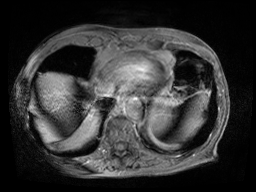

[Series 18: T1 dynamic post-contrast · axial · 2.5mm · 0.74mm/px · z∈[-78,+140]mm · 2 of 88 slices shown]
[im 1/88]
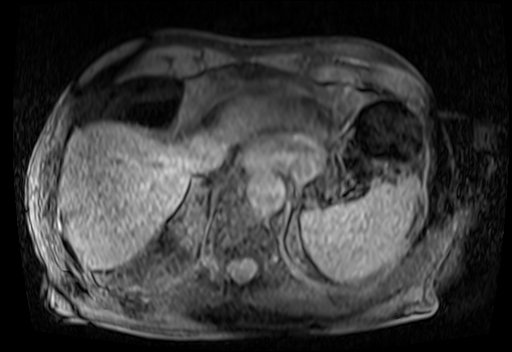
[im 88/88]
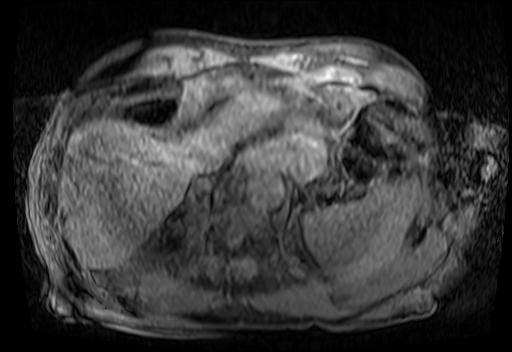

[Series 19: post vibe_sub · axial · 2.5mm · 0.74mm/px · z∈[-78,+140]mm · 3 of 88 slices shown]
[im 1/88]
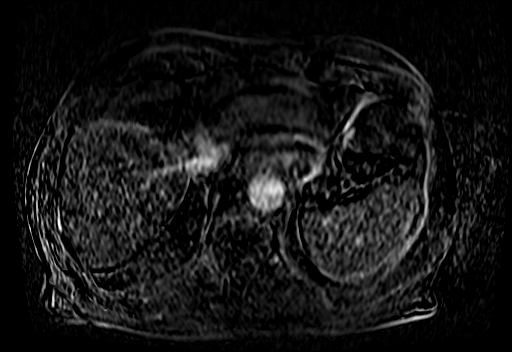
[im 44/88]
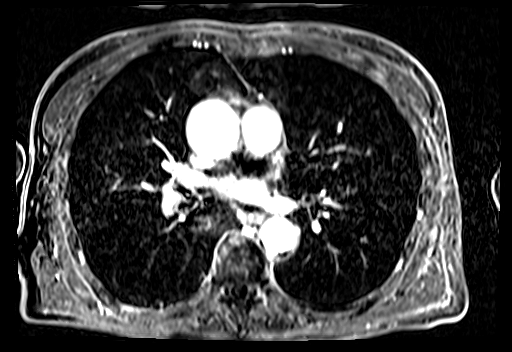
[im 88/88]
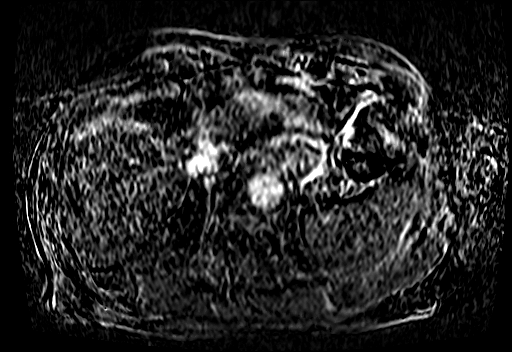

[Series 20: post flash candy · sagittal · 4.0mm · 1.48mm/px · 1 of 26 slices shown]
[im 1/26]
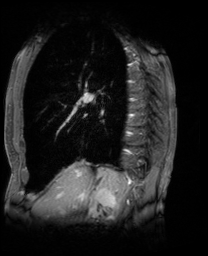

[Series 21: l-r 1 · sagittal · 1.15mm/px · 1 of 15 slices shown]
[im 1/15]
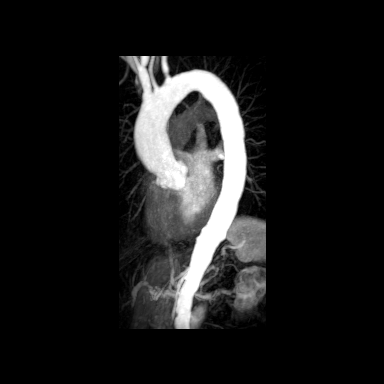

[Series 22: l-r 2 · oblique · 1.15mm/px · 1 of 3 slices shown]
[im 1/3]
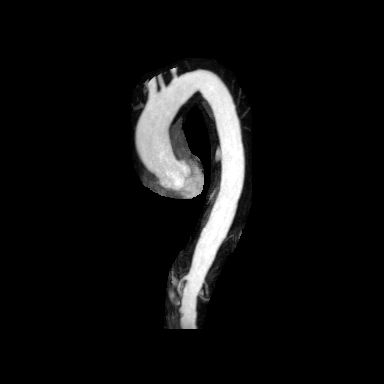

[16 of 16 positions shown; findings below may reference images not displayed]

FINDINGS: VASCULAR

Diameter of the ascending aorta measured perpendicular to the flow
channel, measures approximately 4.2 cm, relatively unchanged from
the comparison MR. no dissection flap. No periaortic fluid.

Greatest diameter measured at the Thakker junction is 3.1 cm,
measured on series 13. Diameter estimated at the annulus 2.7 cm
(series 8).

Three vessel arch, with patency maintained of the branch vessels.
Again, evidence of perhaps 50% narrowing of the proximal left
subclavian artery.

Heart size unchanged with no pericardial fluid/thickening.

NON-VASCULAR

Chest wall unremarkable. Visualized lungs demonstrate no pathologic
signal.

Clear central airways. No mediastinal adenopathy. Unremarkable
appearance of the esophagus. Hiatal hernia partially visualized.
Unremarkable appearance of the visualized spinal canal.
IMPRESSION: Re- demonstration of ascending aortic aneurysm which measures 4.2 cm
on today's study, unchanged from the comparison.

Again the MRI demonstrates approximately 50% narrowing of the left
subclavian artery just after the origin.

## 2018-05-27 DIAGNOSIS — Z79899 Other long term (current) drug therapy: Secondary | ICD-10-CM | POA: Diagnosis not present

## 2018-05-27 DIAGNOSIS — R197 Diarrhea, unspecified: Secondary | ICD-10-CM | POA: Diagnosis not present

## 2018-05-27 DIAGNOSIS — Z681 Body mass index (BMI) 19 or less, adult: Secondary | ICD-10-CM | POA: Diagnosis not present

## 2018-05-27 DIAGNOSIS — E782 Mixed hyperlipidemia: Secondary | ICD-10-CM | POA: Diagnosis not present

## 2018-05-27 DIAGNOSIS — R634 Abnormal weight loss: Secondary | ICD-10-CM | POA: Diagnosis not present

## 2018-06-17 DIAGNOSIS — H34232 Retinal artery branch occlusion, left eye: Secondary | ICD-10-CM | POA: Diagnosis not present

## 2018-09-17 DIAGNOSIS — S61402A Unspecified open wound of left hand, initial encounter: Secondary | ICD-10-CM | POA: Diagnosis not present

## 2018-09-17 DIAGNOSIS — Z6820 Body mass index (BMI) 20.0-20.9, adult: Secondary | ICD-10-CM | POA: Diagnosis not present

## 2018-09-17 DIAGNOSIS — Z23 Encounter for immunization: Secondary | ICD-10-CM | POA: Diagnosis not present

## 2018-09-17 DIAGNOSIS — S62522A Displaced fracture of distal phalanx of left thumb, initial encounter for closed fracture: Secondary | ICD-10-CM | POA: Diagnosis not present

## 2018-09-21 DIAGNOSIS — S62521A Displaced fracture of distal phalanx of right thumb, initial encounter for closed fracture: Secondary | ICD-10-CM | POA: Diagnosis not present

## 2018-09-22 DIAGNOSIS — H34231 Retinal artery branch occlusion, right eye: Secondary | ICD-10-CM | POA: Diagnosis not present

## 2018-09-22 DIAGNOSIS — H34232 Retinal artery branch occlusion, left eye: Secondary | ICD-10-CM | POA: Diagnosis not present

## 2018-09-22 DIAGNOSIS — H25812 Combined forms of age-related cataract, left eye: Secondary | ICD-10-CM | POA: Diagnosis not present

## 2018-09-22 DIAGNOSIS — H40051 Ocular hypertension, right eye: Secondary | ICD-10-CM | POA: Diagnosis not present

## 2018-10-12 DIAGNOSIS — S62521A Displaced fracture of distal phalanx of right thumb, initial encounter for closed fracture: Secondary | ICD-10-CM | POA: Diagnosis not present

## 2018-10-20 DIAGNOSIS — Z23 Encounter for immunization: Secondary | ICD-10-CM | POA: Diagnosis not present

## 2018-10-26 DIAGNOSIS — S62521A Displaced fracture of distal phalanx of right thumb, initial encounter for closed fracture: Secondary | ICD-10-CM | POA: Diagnosis not present

## 2018-10-27 DIAGNOSIS — H40051 Ocular hypertension, right eye: Secondary | ICD-10-CM | POA: Diagnosis not present

## 2018-11-24 DIAGNOSIS — S62521D Displaced fracture of distal phalanx of right thumb, subsequent encounter for fracture with routine healing: Secondary | ICD-10-CM | POA: Diagnosis not present

## 2018-12-08 DIAGNOSIS — H40051 Ocular hypertension, right eye: Secondary | ICD-10-CM | POA: Diagnosis not present

## 2019-01-05 DIAGNOSIS — H903 Sensorineural hearing loss, bilateral: Secondary | ICD-10-CM | POA: Diagnosis not present

## 2019-01-19 DIAGNOSIS — J449 Chronic obstructive pulmonary disease, unspecified: Secondary | ICD-10-CM | POA: Diagnosis not present

## 2019-01-19 DIAGNOSIS — R5383 Other fatigue: Secondary | ICD-10-CM | POA: Diagnosis not present

## 2019-01-19 DIAGNOSIS — Z9181 History of falling: Secondary | ICD-10-CM | POA: Diagnosis not present

## 2019-01-19 DIAGNOSIS — E785 Hyperlipidemia, unspecified: Secondary | ICD-10-CM | POA: Diagnosis not present

## 2019-01-19 DIAGNOSIS — Z0000 Encounter for general adult medical examination without abnormal findings: Secondary | ICD-10-CM | POA: Diagnosis not present

## 2019-01-19 DIAGNOSIS — Z6821 Body mass index (BMI) 21.0-21.9, adult: Secondary | ICD-10-CM | POA: Diagnosis not present

## 2019-01-19 DIAGNOSIS — F17200 Nicotine dependence, unspecified, uncomplicated: Secondary | ICD-10-CM | POA: Diagnosis not present

## 2019-01-19 DIAGNOSIS — E559 Vitamin D deficiency, unspecified: Secondary | ICD-10-CM | POA: Diagnosis not present

## 2019-01-19 DIAGNOSIS — Z79899 Other long term (current) drug therapy: Secondary | ICD-10-CM | POA: Diagnosis not present

## 2019-01-31 DIAGNOSIS — H903 Sensorineural hearing loss, bilateral: Secondary | ICD-10-CM | POA: Diagnosis not present

## 2019-02-09 DIAGNOSIS — G2581 Restless legs syndrome: Secondary | ICD-10-CM | POA: Diagnosis not present

## 2019-02-09 DIAGNOSIS — Z23 Encounter for immunization: Secondary | ICD-10-CM | POA: Diagnosis not present

## 2019-02-09 DIAGNOSIS — D519 Vitamin B12 deficiency anemia, unspecified: Secondary | ICD-10-CM | POA: Diagnosis not present

## 2019-02-09 DIAGNOSIS — E7800 Pure hypercholesterolemia, unspecified: Secondary | ICD-10-CM | POA: Diagnosis not present

## 2019-02-09 DIAGNOSIS — Z6821 Body mass index (BMI) 21.0-21.9, adult: Secondary | ICD-10-CM | POA: Diagnosis not present

## 2019-02-09 DIAGNOSIS — Z9181 History of falling: Secondary | ICD-10-CM | POA: Diagnosis not present

## 2019-02-09 DIAGNOSIS — Z1331 Encounter for screening for depression: Secondary | ICD-10-CM | POA: Diagnosis not present

## 2019-02-09 DIAGNOSIS — Z8673 Personal history of transient ischemic attack (TIA), and cerebral infarction without residual deficits: Secondary | ICD-10-CM | POA: Diagnosis not present

## 2019-02-09 DIAGNOSIS — D510 Vitamin B12 deficiency anemia due to intrinsic factor deficiency: Secondary | ICD-10-CM | POA: Diagnosis not present

## 2019-02-09 DIAGNOSIS — J449 Chronic obstructive pulmonary disease, unspecified: Secondary | ICD-10-CM | POA: Diagnosis not present

## 2019-02-09 DIAGNOSIS — G4700 Insomnia, unspecified: Secondary | ICD-10-CM | POA: Diagnosis not present

## 2019-02-09 DIAGNOSIS — I70219 Atherosclerosis of native arteries of extremities with intermittent claudication, unspecified extremity: Secondary | ICD-10-CM | POA: Diagnosis not present

## 2019-02-23 DIAGNOSIS — S61401A Unspecified open wound of right hand, initial encounter: Secondary | ICD-10-CM | POA: Diagnosis not present

## 2019-05-23 DIAGNOSIS — S8012XA Contusion of left lower leg, initial encounter: Secondary | ICD-10-CM | POA: Diagnosis not present

## 2019-05-23 DIAGNOSIS — Z87891 Personal history of nicotine dependence: Secondary | ICD-10-CM | POA: Diagnosis not present

## 2019-05-23 DIAGNOSIS — I10 Essential (primary) hypertension: Secondary | ICD-10-CM | POA: Diagnosis not present

## 2019-05-23 DIAGNOSIS — Z8673 Personal history of transient ischemic attack (TIA), and cerebral infarction without residual deficits: Secondary | ICD-10-CM | POA: Diagnosis not present

## 2019-05-24 DIAGNOSIS — H5461 Unqualified visual loss, right eye, normal vision left eye: Secondary | ICD-10-CM | POA: Diagnosis not present

## 2019-05-24 DIAGNOSIS — Z8673 Personal history of transient ischemic attack (TIA), and cerebral infarction without residual deficits: Secondary | ICD-10-CM | POA: Diagnosis not present

## 2019-05-24 DIAGNOSIS — I10 Essential (primary) hypertension: Secondary | ICD-10-CM | POA: Diagnosis not present

## 2019-05-24 DIAGNOSIS — S8012XD Contusion of left lower leg, subsequent encounter: Secondary | ICD-10-CM | POA: Diagnosis not present

## 2019-05-24 DIAGNOSIS — Z7902 Long term (current) use of antithrombotics/antiplatelets: Secondary | ICD-10-CM | POA: Diagnosis not present

## 2019-05-24 DIAGNOSIS — Z87891 Personal history of nicotine dependence: Secondary | ICD-10-CM | POA: Diagnosis not present

## 2019-05-24 DIAGNOSIS — Z4801 Encounter for change or removal of surgical wound dressing: Secondary | ICD-10-CM | POA: Diagnosis not present

## 2019-05-24 DIAGNOSIS — Z79899 Other long term (current) drug therapy: Secondary | ICD-10-CM | POA: Diagnosis not present

## 2019-05-25 DIAGNOSIS — I10 Essential (primary) hypertension: Secondary | ICD-10-CM | POA: Diagnosis not present

## 2019-05-25 DIAGNOSIS — S8012XD Contusion of left lower leg, subsequent encounter: Secondary | ICD-10-CM | POA: Diagnosis not present

## 2019-05-25 DIAGNOSIS — S8012XA Contusion of left lower leg, initial encounter: Secondary | ICD-10-CM | POA: Diagnosis not present

## 2019-05-25 DIAGNOSIS — Z5181 Encounter for therapeutic drug level monitoring: Secondary | ICD-10-CM | POA: Diagnosis not present

## 2019-05-25 DIAGNOSIS — D649 Anemia, unspecified: Secondary | ICD-10-CM | POA: Diagnosis not present

## 2019-05-27 DIAGNOSIS — Z4801 Encounter for change or removal of surgical wound dressing: Secondary | ICD-10-CM | POA: Diagnosis not present

## 2019-05-31 DIAGNOSIS — G4700 Insomnia, unspecified: Secondary | ICD-10-CM | POA: Diagnosis not present

## 2019-05-31 DIAGNOSIS — K5909 Other constipation: Secondary | ICD-10-CM | POA: Diagnosis not present

## 2019-05-31 DIAGNOSIS — D510 Vitamin B12 deficiency anemia due to intrinsic factor deficiency: Secondary | ICD-10-CM | POA: Diagnosis not present

## 2019-05-31 DIAGNOSIS — Z681 Body mass index (BMI) 19 or less, adult: Secondary | ICD-10-CM | POA: Diagnosis not present

## 2019-05-31 DIAGNOSIS — M8580 Other specified disorders of bone density and structure, unspecified site: Secondary | ICD-10-CM | POA: Diagnosis not present

## 2019-05-31 DIAGNOSIS — R269 Unspecified abnormalities of gait and mobility: Secondary | ICD-10-CM | POA: Diagnosis not present

## 2019-05-31 DIAGNOSIS — S81802A Unspecified open wound, left lower leg, initial encounter: Secondary | ICD-10-CM | POA: Diagnosis not present

## 2019-06-01 DIAGNOSIS — S8012XS Contusion of left lower leg, sequela: Secondary | ICD-10-CM | POA: Diagnosis not present

## 2019-06-10 DIAGNOSIS — S8012XS Contusion of left lower leg, sequela: Secondary | ICD-10-CM | POA: Diagnosis not present

## 2019-06-10 DIAGNOSIS — Z6820 Body mass index (BMI) 20.0-20.9, adult: Secondary | ICD-10-CM | POA: Diagnosis not present

## 2019-06-17 DIAGNOSIS — H54511A Low vision right eye category 1, normal vision left eye: Secondary | ICD-10-CM | POA: Diagnosis not present

## 2019-06-17 DIAGNOSIS — H5452A1 Low vision left eye category 1, normal vision right eye: Secondary | ICD-10-CM | POA: Diagnosis not present

## 2019-06-17 DIAGNOSIS — H40051 Ocular hypertension, right eye: Secondary | ICD-10-CM | POA: Diagnosis not present

## 2019-06-28 DIAGNOSIS — S8012XS Contusion of left lower leg, sequela: Secondary | ICD-10-CM | POA: Diagnosis not present

## 2019-07-13 DIAGNOSIS — H25812 Combined forms of age-related cataract, left eye: Secondary | ICD-10-CM | POA: Diagnosis not present

## 2019-07-30 DIAGNOSIS — Z9181 History of falling: Secondary | ICD-10-CM | POA: Diagnosis not present

## 2019-07-30 DIAGNOSIS — D62 Acute posthemorrhagic anemia: Secondary | ICD-10-CM | POA: Diagnosis not present

## 2019-07-30 DIAGNOSIS — J449 Chronic obstructive pulmonary disease, unspecified: Secondary | ICD-10-CM | POA: Diagnosis not present

## 2019-07-30 DIAGNOSIS — H9190 Unspecified hearing loss, unspecified ear: Secondary | ICD-10-CM | POA: Diagnosis not present

## 2019-07-30 DIAGNOSIS — F329 Major depressive disorder, single episode, unspecified: Secondary | ICD-10-CM | POA: Diagnosis not present

## 2019-07-30 DIAGNOSIS — E785 Hyperlipidemia, unspecified: Secondary | ICD-10-CM | POA: Diagnosis not present

## 2019-07-30 DIAGNOSIS — K219 Gastro-esophageal reflux disease without esophagitis: Secondary | ICD-10-CM | POA: Diagnosis not present

## 2019-07-30 DIAGNOSIS — S72141A Displaced intertrochanteric fracture of right femur, initial encounter for closed fracture: Secondary | ICD-10-CM | POA: Diagnosis not present

## 2019-07-30 DIAGNOSIS — N289 Disorder of kidney and ureter, unspecified: Secondary | ICD-10-CM | POA: Diagnosis not present

## 2019-07-30 DIAGNOSIS — S79911A Unspecified injury of right hip, initial encounter: Secondary | ICD-10-CM | POA: Diagnosis not present

## 2019-07-30 DIAGNOSIS — Z87891 Personal history of nicotine dependence: Secondary | ICD-10-CM | POA: Diagnosis not present

## 2019-07-30 DIAGNOSIS — S7221XA Displaced subtrochanteric fracture of right femur, initial encounter for closed fracture: Secondary | ICD-10-CM | POA: Diagnosis not present

## 2019-07-30 DIAGNOSIS — I10 Essential (primary) hypertension: Secondary | ICD-10-CM | POA: Diagnosis not present

## 2019-07-30 DIAGNOSIS — R42 Dizziness and giddiness: Secondary | ICD-10-CM | POA: Diagnosis not present

## 2019-07-30 DIAGNOSIS — Z743 Need for continuous supervision: Secondary | ICD-10-CM | POA: Diagnosis not present

## 2019-07-30 DIAGNOSIS — Z2821 Immunization not carried out because of patient refusal: Secondary | ICD-10-CM | POA: Diagnosis not present

## 2019-07-30 DIAGNOSIS — M170 Bilateral primary osteoarthritis of knee: Secondary | ICD-10-CM | POA: Diagnosis not present

## 2019-07-30 DIAGNOSIS — H548 Legal blindness, as defined in USA: Secondary | ICD-10-CM | POA: Diagnosis not present

## 2019-07-30 DIAGNOSIS — Z8673 Personal history of transient ischemic attack (TIA), and cerebral infarction without residual deficits: Secondary | ICD-10-CM | POA: Diagnosis not present

## 2019-07-30 DIAGNOSIS — Z7902 Long term (current) use of antithrombotics/antiplatelets: Secondary | ICD-10-CM | POA: Diagnosis not present

## 2019-07-30 DIAGNOSIS — N400 Enlarged prostate without lower urinary tract symptoms: Secondary | ICD-10-CM | POA: Diagnosis not present

## 2019-07-30 DIAGNOSIS — R279 Unspecified lack of coordination: Secondary | ICD-10-CM | POA: Diagnosis not present

## 2019-07-30 DIAGNOSIS — J90 Pleural effusion, not elsewhere classified: Secondary | ICD-10-CM | POA: Diagnosis not present

## 2019-07-30 DIAGNOSIS — I110 Hypertensive heart disease with heart failure: Secondary | ICD-10-CM | POA: Diagnosis not present

## 2019-07-30 DIAGNOSIS — R5381 Other malaise: Secondary | ICD-10-CM | POA: Diagnosis not present

## 2019-07-30 DIAGNOSIS — Z7289 Other problems related to lifestyle: Secondary | ICD-10-CM | POA: Diagnosis not present

## 2019-07-30 DIAGNOSIS — I639 Cerebral infarction, unspecified: Secondary | ICD-10-CM | POA: Diagnosis not present

## 2019-07-30 DIAGNOSIS — R52 Pain, unspecified: Secondary | ICD-10-CM | POA: Diagnosis not present

## 2019-07-30 DIAGNOSIS — W19XXXA Unspecified fall, initial encounter: Secondary | ICD-10-CM | POA: Diagnosis not present

## 2019-07-30 DIAGNOSIS — Z79899 Other long term (current) drug therapy: Secondary | ICD-10-CM | POA: Diagnosis not present

## 2019-07-31 HISTORY — PX: HIP FRACTURE SURGERY: SHX118

## 2019-08-03 DIAGNOSIS — J449 Chronic obstructive pulmonary disease, unspecified: Secondary | ICD-10-CM | POA: Diagnosis not present

## 2019-08-03 DIAGNOSIS — Z79891 Long term (current) use of opiate analgesic: Secondary | ICD-10-CM | POA: Diagnosis not present

## 2019-08-03 DIAGNOSIS — S72141D Displaced intertrochanteric fracture of right femur, subsequent encounter for closed fracture with routine healing: Secondary | ICD-10-CM | POA: Diagnosis not present

## 2019-08-03 DIAGNOSIS — H548 Legal blindness, as defined in USA: Secondary | ICD-10-CM | POA: Diagnosis not present

## 2019-08-03 DIAGNOSIS — K219 Gastro-esophageal reflux disease without esophagitis: Secondary | ICD-10-CM | POA: Diagnosis not present

## 2019-08-03 DIAGNOSIS — Z8673 Personal history of transient ischemic attack (TIA), and cerebral infarction without residual deficits: Secondary | ICD-10-CM | POA: Diagnosis not present

## 2019-08-03 DIAGNOSIS — R42 Dizziness and giddiness: Secondary | ICD-10-CM | POA: Diagnosis not present

## 2019-08-03 DIAGNOSIS — Z7982 Long term (current) use of aspirin: Secondary | ICD-10-CM | POA: Diagnosis not present

## 2019-08-03 DIAGNOSIS — H9190 Unspecified hearing loss, unspecified ear: Secondary | ICD-10-CM | POA: Diagnosis not present

## 2019-08-03 DIAGNOSIS — Z79899 Other long term (current) drug therapy: Secondary | ICD-10-CM | POA: Diagnosis not present

## 2019-08-03 DIAGNOSIS — E785 Hyperlipidemia, unspecified: Secondary | ICD-10-CM | POA: Diagnosis not present

## 2019-08-03 DIAGNOSIS — Z7902 Long term (current) use of antithrombotics/antiplatelets: Secondary | ICD-10-CM | POA: Diagnosis not present

## 2019-08-03 DIAGNOSIS — F329 Major depressive disorder, single episode, unspecified: Secondary | ICD-10-CM | POA: Diagnosis not present

## 2019-08-03 DIAGNOSIS — W109XXD Fall (on) (from) unspecified stairs and steps, subsequent encounter: Secondary | ICD-10-CM | POA: Diagnosis not present

## 2019-08-03 DIAGNOSIS — Z87891 Personal history of nicotine dependence: Secondary | ICD-10-CM | POA: Diagnosis not present

## 2019-08-03 DIAGNOSIS — Z9181 History of falling: Secondary | ICD-10-CM | POA: Diagnosis not present

## 2019-08-03 DIAGNOSIS — I10 Essential (primary) hypertension: Secondary | ICD-10-CM | POA: Diagnosis not present

## 2019-08-03 DIAGNOSIS — N400 Enlarged prostate without lower urinary tract symptoms: Secondary | ICD-10-CM | POA: Diagnosis not present

## 2019-08-11 DIAGNOSIS — N401 Enlarged prostate with lower urinary tract symptoms: Secondary | ICD-10-CM | POA: Diagnosis not present

## 2019-08-11 DIAGNOSIS — N3090 Cystitis, unspecified without hematuria: Secondary | ICD-10-CM | POA: Diagnosis not present

## 2019-08-11 DIAGNOSIS — R339 Retention of urine, unspecified: Secondary | ICD-10-CM | POA: Diagnosis not present

## 2019-08-11 DIAGNOSIS — N3000 Acute cystitis without hematuria: Secondary | ICD-10-CM | POA: Diagnosis not present

## 2019-09-02 DIAGNOSIS — Z79899 Other long term (current) drug therapy: Secondary | ICD-10-CM | POA: Diagnosis not present

## 2019-09-02 DIAGNOSIS — Z79891 Long term (current) use of opiate analgesic: Secondary | ICD-10-CM | POA: Diagnosis not present

## 2019-09-02 DIAGNOSIS — Z8673 Personal history of transient ischemic attack (TIA), and cerebral infarction without residual deficits: Secondary | ICD-10-CM | POA: Diagnosis not present

## 2019-09-02 DIAGNOSIS — Z87891 Personal history of nicotine dependence: Secondary | ICD-10-CM | POA: Diagnosis not present

## 2019-09-02 DIAGNOSIS — H548 Legal blindness, as defined in USA: Secondary | ICD-10-CM | POA: Diagnosis not present

## 2019-09-02 DIAGNOSIS — J449 Chronic obstructive pulmonary disease, unspecified: Secondary | ICD-10-CM | POA: Diagnosis not present

## 2019-09-02 DIAGNOSIS — F329 Major depressive disorder, single episode, unspecified: Secondary | ICD-10-CM | POA: Diagnosis not present

## 2019-09-02 DIAGNOSIS — Z7902 Long term (current) use of antithrombotics/antiplatelets: Secondary | ICD-10-CM | POA: Diagnosis not present

## 2019-09-02 DIAGNOSIS — K219 Gastro-esophageal reflux disease without esophagitis: Secondary | ICD-10-CM | POA: Diagnosis not present

## 2019-09-02 DIAGNOSIS — R42 Dizziness and giddiness: Secondary | ICD-10-CM | POA: Diagnosis not present

## 2019-09-02 DIAGNOSIS — H9190 Unspecified hearing loss, unspecified ear: Secondary | ICD-10-CM | POA: Diagnosis not present

## 2019-09-02 DIAGNOSIS — S72141D Displaced intertrochanteric fracture of right femur, subsequent encounter for closed fracture with routine healing: Secondary | ICD-10-CM | POA: Diagnosis not present

## 2019-09-02 DIAGNOSIS — I10 Essential (primary) hypertension: Secondary | ICD-10-CM | POA: Diagnosis not present

## 2019-09-02 DIAGNOSIS — N400 Enlarged prostate without lower urinary tract symptoms: Secondary | ICD-10-CM | POA: Diagnosis not present

## 2019-09-02 DIAGNOSIS — E785 Hyperlipidemia, unspecified: Secondary | ICD-10-CM | POA: Diagnosis not present

## 2019-09-02 DIAGNOSIS — Z9181 History of falling: Secondary | ICD-10-CM | POA: Diagnosis not present

## 2019-09-02 DIAGNOSIS — Z7982 Long term (current) use of aspirin: Secondary | ICD-10-CM | POA: Diagnosis not present

## 2019-09-02 DIAGNOSIS — W109XXD Fall (on) (from) unspecified stairs and steps, subsequent encounter: Secondary | ICD-10-CM | POA: Diagnosis not present

## 2019-09-26 DIAGNOSIS — S72141D Displaced intertrochanteric fracture of right femur, subsequent encounter for closed fracture with routine healing: Secondary | ICD-10-CM | POA: Diagnosis not present

## 2019-09-26 DIAGNOSIS — N390 Urinary tract infection, site not specified: Secondary | ICD-10-CM | POA: Diagnosis not present

## 2019-09-28 DIAGNOSIS — L89601 Pressure ulcer of unspecified heel, stage 1: Secondary | ICD-10-CM | POA: Diagnosis not present

## 2019-09-28 DIAGNOSIS — Z23 Encounter for immunization: Secondary | ICD-10-CM | POA: Diagnosis not present

## 2019-09-28 DIAGNOSIS — Z681 Body mass index (BMI) 19 or less, adult: Secondary | ICD-10-CM | POA: Diagnosis not present

## 2019-09-28 DIAGNOSIS — N390 Urinary tract infection, site not specified: Secondary | ICD-10-CM | POA: Diagnosis not present

## 2019-09-28 DIAGNOSIS — G4700 Insomnia, unspecified: Secondary | ICD-10-CM | POA: Diagnosis not present

## 2019-09-28 DIAGNOSIS — I739 Peripheral vascular disease, unspecified: Secondary | ICD-10-CM | POA: Diagnosis not present

## 2019-10-21 DIAGNOSIS — S72141A Displaced intertrochanteric fracture of right femur, initial encounter for closed fracture: Secondary | ICD-10-CM | POA: Diagnosis not present

## 2020-01-10 DIAGNOSIS — S7223XA Displaced subtrochanteric fracture of unspecified femur, initial encounter for closed fracture: Secondary | ICD-10-CM | POA: Diagnosis not present

## 2020-01-10 DIAGNOSIS — S72141A Displaced intertrochanteric fracture of right femur, initial encounter for closed fracture: Secondary | ICD-10-CM | POA: Diagnosis not present

## 2020-03-05 ENCOUNTER — Institutional Professional Consult (permissible substitution): Payer: PPO | Admitting: Pulmonary Disease

## 2020-04-05 DIAGNOSIS — E7800 Pure hypercholesterolemia, unspecified: Secondary | ICD-10-CM | POA: Diagnosis not present

## 2020-04-05 DIAGNOSIS — M1990 Unspecified osteoarthritis, unspecified site: Secondary | ICD-10-CM | POA: Diagnosis not present

## 2020-04-05 DIAGNOSIS — Z0000 Encounter for general adult medical examination without abnormal findings: Secondary | ICD-10-CM | POA: Diagnosis not present

## 2020-04-05 DIAGNOSIS — J302 Other seasonal allergic rhinitis: Secondary | ICD-10-CM | POA: Diagnosis not present

## 2020-04-05 DIAGNOSIS — D510 Vitamin B12 deficiency anemia due to intrinsic factor deficiency: Secondary | ICD-10-CM | POA: Diagnosis not present

## 2020-04-05 DIAGNOSIS — Z79899 Other long term (current) drug therapy: Secondary | ICD-10-CM | POA: Diagnosis not present

## 2020-04-05 DIAGNOSIS — Z9181 History of falling: Secondary | ICD-10-CM | POA: Diagnosis not present

## 2020-04-05 DIAGNOSIS — Z681 Body mass index (BMI) 19 or less, adult: Secondary | ICD-10-CM | POA: Diagnosis not present

## 2020-04-05 DIAGNOSIS — R269 Unspecified abnormalities of gait and mobility: Secondary | ICD-10-CM | POA: Diagnosis not present

## 2020-04-12 DIAGNOSIS — I739 Peripheral vascular disease, unspecified: Secondary | ICD-10-CM | POA: Diagnosis not present

## 2020-04-25 DIAGNOSIS — D485 Neoplasm of uncertain behavior of skin: Secondary | ICD-10-CM | POA: Diagnosis not present

## 2020-04-25 DIAGNOSIS — L821 Other seborrheic keratosis: Secondary | ICD-10-CM | POA: Diagnosis not present

## 2020-04-25 DIAGNOSIS — D225 Melanocytic nevi of trunk: Secondary | ICD-10-CM | POA: Diagnosis not present

## 2020-05-08 DIAGNOSIS — D485 Neoplasm of uncertain behavior of skin: Secondary | ICD-10-CM | POA: Diagnosis not present

## 2020-05-08 DIAGNOSIS — C44519 Basal cell carcinoma of skin of other part of trunk: Secondary | ICD-10-CM | POA: Diagnosis not present

## 2020-05-23 DIAGNOSIS — L309 Dermatitis, unspecified: Secondary | ICD-10-CM | POA: Diagnosis not present

## 2020-05-23 DIAGNOSIS — L299 Pruritus, unspecified: Secondary | ICD-10-CM | POA: Diagnosis not present

## 2020-05-25 ENCOUNTER — Other Ambulatory Visit: Payer: Self-pay | Admitting: *Deleted

## 2020-05-25 DIAGNOSIS — M25562 Pain in left knee: Secondary | ICD-10-CM

## 2020-05-25 DIAGNOSIS — M25561 Pain in right knee: Secondary | ICD-10-CM

## 2020-05-25 NOTE — Progress Notes (Signed)
Opened in error

## 2020-05-29 ENCOUNTER — Ambulatory Visit (INDEPENDENT_AMBULATORY_CARE_PROVIDER_SITE_OTHER): Payer: PPO | Admitting: Vascular Surgery

## 2020-05-29 ENCOUNTER — Other Ambulatory Visit: Payer: Self-pay

## 2020-05-29 ENCOUNTER — Ambulatory Visit (HOSPITAL_COMMUNITY)
Admission: RE | Admit: 2020-05-29 | Discharge: 2020-05-29 | Disposition: A | Discharge status: Home / Self Care | Payer: PPO | Source: Ambulatory Visit | Attending: Vascular Surgery | Admitting: Vascular Surgery

## 2020-05-29 DIAGNOSIS — I739 Peripheral vascular disease, unspecified: Secondary | ICD-10-CM

## 2020-05-29 DIAGNOSIS — M25562 Pain in left knee: Secondary | ICD-10-CM | POA: Insufficient documentation

## 2020-05-29 DIAGNOSIS — M25561 Pain in right knee: Secondary | ICD-10-CM | POA: Diagnosis not present

## 2020-05-29 HISTORY — DX: Peripheral vascular disease, unspecified: I73.9

## 2020-05-29 NOTE — Progress Notes (Signed)
Patient name: David Figueroa MRN: [id number] DOB: 1928-02-23 Sex: male  REASON FOR CONSULT: Evaluate for PAD  HPI: David Figueroa is a 86 y.o. male, with history of COPD and hyperlipidemia that presents for evaluation of PAD.  His wife states that his primary care physician was having difficulty finding pulses in his extremities.  As a result he got some noninvasive imaging and then was sent over for further vascular evaluation.  Wife seems very concerned about the bruising on his shins and arms and states he is always having skin tears.  He does walk with a walker and appears to have some gait instability.  His main complaint is right hip pain where he previously broke his hip last year and also sometimes describes some pain in his thighs.  Does not feel that one leg is worse than another.  Admits that he is 86 years old and happy to be living independently.  Past Medical History:  Diagnosis Date  . COPD (chronic obstructive pulmonary disease) ([place])   . DDD (degenerative disc disease), cervical   . Hypercholesterolemia   . Sinusitis   . Vocal cord paralysis     No past surgical history on file.  No family history on file.  SOCIAL HISTORY: Social History   Socioeconomic History  . Marital status: Married    Spouse name: Not on file  . Number of children: Not on file  . Years of education: Not on file  . Highest education level: Not on file  Occupational History  . Not on file  Tobacco Use  . Smoking status: Former Smoker    Packs/day: 0.75    Years: 50.00    Pack years: 37.50    Types: Cigarettes, Pipe    Quit date: 03/30/2010    Years since quitting: 10.1  . Smokeless tobacco: Never Used  Substance and Sexual Activity  . Alcohol use: Yes  . Drug use: Not on file  . Sexual activity: Not on file  Other Topics Concern  . Not on file  Social History Narrative  . Not on file   Social Determinants of Health   Financial Resource Strain:   . Difficulty of Paying  Living Expenses:   Food Insecurity:   . Worried About Charity fundraiser in the Last Year:   . Arboriculturist in the Last Year:   Transportation Needs:   . Film/video editor (Medical):   Marland Kitchen Lack of Transportation (Non-Medical):   Physical Activity:   . Days of Exercise per Week:   . Minutes of Exercise per Session:   Stress:   . Feeling of Stress :   Social Connections:   . Frequency of Communication with Friends and Family:   . Frequency of Social Gatherings with Friends and Family:   . Attends Religious Services:   . Active Member of Clubs or Organizations:   . Attends Archivist Meetings:   Marland Kitchen Marital Status:   Intimate Partner Violence:   . Fear of Current or Ex-Partner:   . Emotionally Abused:   Marland Kitchen Physically Abused:   . Sexually Abused:     Allergies  Allergen Reactions  . Varenicline Tartrate Other (See Comments)    GI discomfort GI discomfort GI discomfort     Current Outpatient Medications  Medication Sig Dispense Refill  . atorvastatin (LIPITOR) 40 MG tablet Take 40 mg by mouth at bedtime.    . citalopram (CELEXA) 10 MG tablet Take 10 mg  by mouth daily.    . clopidogrel (PLAVIX) 75 MG tablet Take 75 mg by mouth daily.    Marland Kitchen dicyclomine (BENTYL) 20 MG tablet Take by mouth.    . finasteride (PROSCAR) 5 MG tablet Take 5 mg by mouth daily.    Marland Kitchen losartan (COZAAR) 25 MG tablet Take 25 mg by mouth daily.    . Multiple Vitamins-Minerals (MULTIVITAMIN PO) Take by mouth.    . Tiotropium Bromide Monohydrate (SPIRIVA RESPIMAT) 2.5 MCG/ACT AERS Inhale 2 puffs into the lungs daily. 1 Inhaler 3  . dutasteride (AVODART) 0.5 MG capsule Take 0.5 mg by mouth daily.    . Fluticasone-Salmeterol (ADVAIR) 100-50 MCG/DOSE AEPB Inhale 1 puff into the lungs every 12 (twelve) hours. (Patient not taking: Reported on 05/29/2020) 180 each 1  . hydrocortisone (ANUSOL-HC) 2.5 % rectal cream Place rectally as needed for hemorrhoids.    Marland Kitchen oxazepam (SERAX) 15 MG capsule Take 15 mg  by mouth at bedtime as needed for sleep or anxiety. One or two as needed hs    . pravastatin (PRAVACHOL) 40 MG tablet Take 40 mg by mouth daily.     No current facility-administered medications for this visit.    REVIEW OF SYSTEMS:  [X]  denotes positive finding, [ ]  denotes negative finding Cardiac  Comments:  Chest pain or chest pressure:    Shortness of breath upon exertion:    Short of breath when lying flat:    Irregular heart rhythm:        Vascular    Pain in calf, thigh, or hip brought on by ambulation:    Pain in feet at night that wakes you up from your sleep:     Blood clot in your veins:    Leg swelling:         Pulmonary    Oxygen at home:    Productive cough:     Wheezing:         Neurologic    Sudden weakness in arms or legs:     Sudden numbness in arms or legs:     Sudden onset of difficulty speaking or slurred speech:    Temporary loss of vision in one eye:     Problems with dizziness:         Gastrointestinal    Blood in stool:     Vomited blood:         Genitourinary    Burning when urinating:     Blood in urine:        Psychiatric    Major depression:         Hematologic    Bleeding problems:    Problems with blood clotting too easily:        Skin    Rashes or ulcers:        Constitutional    Fever or chills:      PHYSICAL EXAM: Vitals:   05/29/20 1354  BP: 123/79  Pulse: 79  Resp: 16  Temp: 97.7 F (36.5 C)  TempSrc: Temporal  Weight: 130 lb (59 kg)  Height: 5\' 11"  (1.803 m)    GENERAL: The patient is a well-nourished male, in no acute distress. The vital signs are documented above. CARDIAC: There is a regular rate and rhythm.  VASCULAR:  Palpable femoral pulses bilaterally Palpable popliteal pulses bilaterally Right DP palpable No left pedal pulses palpable PULMONARY: There is good air exchange bilaterally without wheezing or rales. ABDOMEN: Soft and non-tender with normal pitched bowel sounds.  MUSCULOSKELETAL: There  are no major deformities or cyanosis. NEUROLOGIC: No focal weakness or paresthesias are detected. SKIN: Shin bruising as pictured below. PSYCHIATRIC: The patient has a normal affect.      DATA:   ABIs are 1.18 on the right biphasic and 1.03 on the left monophasic  Assessment/Plan:  86 year old male presents for evaluation of PAD.  I discussed with both him and his wife that given his age of 38 years old with high frailty index walking with a walker I would reserve intervention for critical limb ischemia if he had severe rest pain in the foot or a nonhealing wounds.  I think all the wounds on his shins are in different stages of healing and appear to be related to trauma and likely bruising from his Plavix.  He actually has a palpable right pedal pulse and I cannot appreciate a left pedal pulse.  Certainly more severe PAD in left leg based on waveforms.  His symptoms do not really correlate to warrant a left leg intervention given most of his pain is in the right hip at this time.  I discussed we can follow-up in 6 months and continue surveillance for now.   Marty Heck, MD [facility] Office: [phone]

## 2020-06-01 ENCOUNTER — Other Ambulatory Visit: Payer: Self-pay | Admitting: *Deleted

## 2020-06-01 DIAGNOSIS — I739 Peripheral vascular disease, unspecified: Secondary | ICD-10-CM

## 2020-07-04 DIAGNOSIS — H3411 Central retinal artery occlusion, right eye: Secondary | ICD-10-CM | POA: Diagnosis not present

## 2020-07-04 DIAGNOSIS — H25812 Combined forms of age-related cataract, left eye: Secondary | ICD-10-CM | POA: Diagnosis not present

## 2020-07-04 DIAGNOSIS — H40051 Ocular hypertension, right eye: Secondary | ICD-10-CM | POA: Diagnosis not present

## 2020-07-04 DIAGNOSIS — H401112 Primary open-angle glaucoma, right eye, moderate stage: Secondary | ICD-10-CM | POA: Diagnosis not present

## 2020-07-04 DIAGNOSIS — H34232 Retinal artery branch occlusion, left eye: Secondary | ICD-10-CM | POA: Diagnosis not present

## 2020-07-05 DIAGNOSIS — R634 Abnormal weight loss: Secondary | ICD-10-CM | POA: Diagnosis not present

## 2020-07-05 DIAGNOSIS — R5383 Other fatigue: Secondary | ICD-10-CM | POA: Diagnosis not present

## 2020-07-05 DIAGNOSIS — N400 Enlarged prostate without lower urinary tract symptoms: Secondary | ICD-10-CM | POA: Diagnosis not present

## 2020-07-05 DIAGNOSIS — R195 Other fecal abnormalities: Secondary | ICD-10-CM | POA: Diagnosis not present

## 2020-07-05 DIAGNOSIS — G4700 Insomnia, unspecified: Secondary | ICD-10-CM | POA: Diagnosis not present

## 2020-07-05 DIAGNOSIS — Z681 Body mass index (BMI) 19 or less, adult: Secondary | ICD-10-CM | POA: Diagnosis not present

## 2020-07-10 DIAGNOSIS — S7223XA Displaced subtrochanteric fracture of unspecified femur, initial encounter for closed fracture: Secondary | ICD-10-CM | POA: Diagnosis not present

## 2020-07-10 DIAGNOSIS — S72141A Displaced intertrochanteric fracture of right femur, initial encounter for closed fracture: Secondary | ICD-10-CM | POA: Diagnosis not present

## 2020-07-12 DIAGNOSIS — I10 Essential (primary) hypertension: Secondary | ICD-10-CM | POA: Diagnosis not present

## 2020-07-12 DIAGNOSIS — Z8673 Personal history of transient ischemic attack (TIA), and cerebral infarction without residual deficits: Secondary | ICD-10-CM | POA: Diagnosis not present

## 2020-07-12 DIAGNOSIS — F329 Major depressive disorder, single episode, unspecified: Secondary | ICD-10-CM | POA: Diagnosis not present

## 2020-07-12 DIAGNOSIS — Z7982 Long term (current) use of aspirin: Secondary | ICD-10-CM | POA: Diagnosis not present

## 2020-07-12 DIAGNOSIS — Z7902 Long term (current) use of antithrombotics/antiplatelets: Secondary | ICD-10-CM | POA: Diagnosis not present

## 2020-07-12 DIAGNOSIS — M5030 Other cervical disc degeneration, unspecified cervical region: Secondary | ICD-10-CM | POA: Diagnosis not present

## 2020-07-12 DIAGNOSIS — G4700 Insomnia, unspecified: Secondary | ICD-10-CM | POA: Diagnosis not present

## 2020-07-12 DIAGNOSIS — J449 Chronic obstructive pulmonary disease, unspecified: Secondary | ICD-10-CM | POA: Diagnosis not present

## 2020-07-12 DIAGNOSIS — Z9181 History of falling: Secondary | ICD-10-CM | POA: Diagnosis not present

## 2020-07-12 DIAGNOSIS — Z79891 Long term (current) use of opiate analgesic: Secondary | ICD-10-CM | POA: Diagnosis not present

## 2020-07-12 DIAGNOSIS — N400 Enlarged prostate without lower urinary tract symptoms: Secondary | ICD-10-CM | POA: Diagnosis not present

## 2020-07-12 DIAGNOSIS — W109XXD Fall (on) (from) unspecified stairs and steps, subsequent encounter: Secondary | ICD-10-CM | POA: Diagnosis not present

## 2020-07-12 DIAGNOSIS — Z87891 Personal history of nicotine dependence: Secondary | ICD-10-CM | POA: Diagnosis not present

## 2020-07-12 DIAGNOSIS — S72141D Displaced intertrochanteric fracture of right femur, subsequent encounter for closed fracture with routine healing: Secondary | ICD-10-CM | POA: Diagnosis not present

## 2020-07-12 DIAGNOSIS — H9193 Unspecified hearing loss, bilateral: Secondary | ICD-10-CM | POA: Diagnosis not present

## 2020-07-12 DIAGNOSIS — H9190 Unspecified hearing loss, unspecified ear: Secondary | ICD-10-CM | POA: Diagnosis not present

## 2020-07-12 DIAGNOSIS — K219 Gastro-esophageal reflux disease without esophagitis: Secondary | ICD-10-CM | POA: Diagnosis not present

## 2020-07-12 DIAGNOSIS — D510 Vitamin B12 deficiency anemia due to intrinsic factor deficiency: Secondary | ICD-10-CM | POA: Diagnosis not present

## 2020-07-12 DIAGNOSIS — E785 Hyperlipidemia, unspecified: Secondary | ICD-10-CM | POA: Diagnosis not present

## 2020-07-12 DIAGNOSIS — Z79899 Other long term (current) drug therapy: Secondary | ICD-10-CM | POA: Diagnosis not present

## 2020-07-12 DIAGNOSIS — R42 Dizziness and giddiness: Secondary | ICD-10-CM | POA: Diagnosis not present

## 2020-07-12 DIAGNOSIS — H548 Legal blindness, as defined in USA: Secondary | ICD-10-CM | POA: Diagnosis not present

## 2020-07-27 DIAGNOSIS — J449 Chronic obstructive pulmonary disease, unspecified: Secondary | ICD-10-CM | POA: Diagnosis not present

## 2020-07-27 DIAGNOSIS — R262 Difficulty in walking, not elsewhere classified: Secondary | ICD-10-CM | POA: Diagnosis not present

## 2020-08-08 DIAGNOSIS — I129 Hypertensive chronic kidney disease with stage 1 through stage 4 chronic kidney disease, or unspecified chronic kidney disease: Secondary | ICD-10-CM | POA: Diagnosis not present

## 2020-08-08 DIAGNOSIS — E875 Hyperkalemia: Secondary | ICD-10-CM | POA: Diagnosis not present

## 2020-08-08 DIAGNOSIS — Z87891 Personal history of nicotine dependence: Secondary | ICD-10-CM | POA: Diagnosis not present

## 2020-08-08 DIAGNOSIS — J449 Chronic obstructive pulmonary disease, unspecified: Secondary | ICD-10-CM | POA: Diagnosis not present

## 2020-08-08 DIAGNOSIS — I700 Atherosclerosis of aorta: Secondary | ICD-10-CM | POA: Diagnosis not present

## 2020-08-08 DIAGNOSIS — N189 Chronic kidney disease, unspecified: Secondary | ICD-10-CM | POA: Diagnosis not present

## 2020-08-08 DIAGNOSIS — R0602 Shortness of breath: Secondary | ICD-10-CM | POA: Diagnosis not present

## 2020-08-08 DIAGNOSIS — R05 Cough: Secondary | ICD-10-CM | POA: Diagnosis not present

## 2020-08-10 ENCOUNTER — Ambulatory Visit: Payer: PPO | Admitting: Sports Medicine

## 2020-08-10 ENCOUNTER — Encounter: Payer: Self-pay | Admitting: Sports Medicine

## 2020-08-10 ENCOUNTER — Other Ambulatory Visit: Payer: Self-pay

## 2020-08-10 DIAGNOSIS — B351 Tinea unguium: Secondary | ICD-10-CM | POA: Diagnosis not present

## 2020-08-10 DIAGNOSIS — Z7901 Long term (current) use of anticoagulants: Secondary | ICD-10-CM | POA: Diagnosis not present

## 2020-08-10 DIAGNOSIS — Z7982 Long term (current) use of aspirin: Secondary | ICD-10-CM

## 2020-08-10 DIAGNOSIS — I739 Peripheral vascular disease, unspecified: Secondary | ICD-10-CM | POA: Diagnosis not present

## 2020-08-10 DIAGNOSIS — M79675 Pain in left toe(s): Secondary | ICD-10-CM

## 2020-08-10 DIAGNOSIS — M79674 Pain in right toe(s): Secondary | ICD-10-CM

## 2020-08-10 NOTE — Progress Notes (Signed)
Subjective: David Figueroa is a 86 y.o. male patient seen today in office with complaint of mildly painful thickened and elongated toenails; unable to trim. Patient reports some soreness on some of his toes right at the nails and has been using triple ointment to them with some relief. Patient has no other pedal complaints at this time.   Patient is hard of hearing and unable to tell a complete history however per chart review patient was recently seen by vascular 2 months ago.  No intervention needed or warranted.  Patient on Plavix for history of arrhythmia.  Patient Active Problem List   Diagnosis Date Noted  . PAD (peripheral artery disease) ([place]) 05/29/2020  . Vocal cord paralysis 03/11/2018  . COPD (chronic obstructive pulmonary disease) ([place]) 03/11/2018  . Chronic respiratory failure with hypoxia ([place]) 03/11/2018  . DOE (dyspnea on exertion) 02/19/2018  . Former smoker 02/19/2018  . Poor historian 02/19/2018  . Stenosis of left subclavian artery ([place]) 02/19/2018  . Ventricular tachyarrhythmia ([place]) 02/19/2018  . Long-term use of aspirin therapy 02/18/2018  . Mixed hyperlipidemia 02/18/2018  . Old cerebrovascular accident (CVA) without late effect 02/18/2018  . Thoracic aortic aneurysm without mention of rupture 03/30/2013    Current Outpatient Medications on File Prior to Visit  Medication Sig Dispense Refill  . atorvastatin (LIPITOR) 40 MG tablet Take 40 mg by mouth at bedtime.    . citalopram (CELEXA) 10 MG tablet Take 10 mg by mouth daily.    . clopidogrel (PLAVIX) 75 MG tablet Take 75 mg by mouth daily.    Marland Kitchen dicyclomine (BENTYL) 20 MG tablet Take by mouth.    . dutasteride (AVODART) 0.5 MG capsule Take 0.5 mg by mouth daily.    . finasteride (PROSCAR) 5 MG tablet Take 5 mg by mouth daily.    . Fluticasone-Salmeterol (ADVAIR) 100-50 MCG/DOSE AEPB Inhale 1 puff into the lungs every 12 (twelve) hours. (Patient not taking: Reported on 05/29/2020) 180 each 1  . hydrocortisone  (ANUSOL-HC) 2.5 % rectal cream Place rectally as needed for hemorrhoids.    Marland Kitchen losartan (COZAAR) 25 MG tablet Take 25 mg by mouth daily.    . mirtazapine (REMERON) 15 MG tablet Take 15 mg by mouth at bedtime.    . Multiple Vitamins-Minerals (MULTIVITAMIN PO) Take by mouth.    . oxazepam (SERAX) 15 MG capsule Take 15 mg by mouth at bedtime as needed for sleep or anxiety. One or two as needed hs    . pravastatin (PRAVACHOL) 40 MG tablet Take 40 mg by mouth daily.    . tamsulosin (FLOMAX) 0.4 MG CAPS capsule Take 0.4 mg by mouth at bedtime.    . Tiotropium Bromide Monohydrate (SPIRIVA RESPIMAT) 2.5 MCG/ACT AERS Inhale 2 puffs into the lungs daily. 1 Inhaler 3  . triamcinolone (KENALOG) 0.025 % cream APPLY TO THE AFFECTED AREA TWICE DAILY FOR 7 TO 10 DAYS     No current facility-administered medications on file prior to visit.    Allergies  Allergen Reactions  . Varenicline Tartrate Other (See Comments)    GI discomfort GI discomfort GI discomfort     Objective: Physical Exam  General: Well developed, nourished, no acute distress, awake, alert and oriented x 2  Vascular: Dorsalis pedis artery 1/4 bilateral, Posterior tibial artery 0/4 bilateral, skin temperature warm to cool proximal to distal bilateral lower extremities, severe varicosities and hematoma to the lower extremities, no pedal hair present bilateral.  Neurological: Gross sensation present via light touch bilateral.   Dermatological:  Skin is warm, dry, and supple bilateral, Nails 1-10 are tender, long, thick, and discolored with moderate subungal debris, no webspace macerations present bilateral, no open lesions present bilateral, no callus/corns/hyperkeratotic tissue present bilateral. No signs of infection bilateral.  Musculoskeletal: Asymptomatic hammertoe and fat pad atrophy noted bilateral. Muscular strength diminished without painon range of motion. No pain with calf compression bilateral.  Walker assisted  gait  Assessment and Plan:  Problem List Items Addressed This Visit      Cardiovascular and Mediastinum   PAD (peripheral artery disease) ([place])     Other   Long-term use of aspirin therapy    Other Visit Diagnoses    Pain due to onychomycosis of toenails of both feet    -  Primary   Current use of long term anticoagulation          -Examined patient.  -Discussed treatment options for painful mycotic nails. -Mechanically debrided and reduced mycotic nails with sterile nail nipper and dremel nail file without incident. -May continue with topical triple cream as needed for any pain around toes -Continue with walker for stability in gait -Continue with vascular follow-up -Patient to return in 3 to 4 months for follow up evaluation or sooner if symptoms worsen.  Landis Martins, DPM

## 2020-08-11 DIAGNOSIS — I10 Essential (primary) hypertension: Secondary | ICD-10-CM | POA: Diagnosis not present

## 2020-08-11 DIAGNOSIS — D510 Vitamin B12 deficiency anemia due to intrinsic factor deficiency: Secondary | ICD-10-CM | POA: Diagnosis not present

## 2020-08-11 DIAGNOSIS — E785 Hyperlipidemia, unspecified: Secondary | ICD-10-CM | POA: Diagnosis not present

## 2020-08-11 DIAGNOSIS — J449 Chronic obstructive pulmonary disease, unspecified: Secondary | ICD-10-CM | POA: Diagnosis not present

## 2020-08-11 DIAGNOSIS — Z87891 Personal history of nicotine dependence: Secondary | ICD-10-CM | POA: Diagnosis not present

## 2020-08-11 DIAGNOSIS — H9193 Unspecified hearing loss, bilateral: Secondary | ICD-10-CM | POA: Diagnosis not present

## 2020-08-11 DIAGNOSIS — M5030 Other cervical disc degeneration, unspecified cervical region: Secondary | ICD-10-CM | POA: Diagnosis not present

## 2020-08-11 DIAGNOSIS — Z8673 Personal history of transient ischemic attack (TIA), and cerebral infarction without residual deficits: Secondary | ICD-10-CM | POA: Diagnosis not present

## 2020-08-11 DIAGNOSIS — Z7982 Long term (current) use of aspirin: Secondary | ICD-10-CM | POA: Diagnosis not present

## 2020-08-11 DIAGNOSIS — Z7902 Long term (current) use of antithrombotics/antiplatelets: Secondary | ICD-10-CM | POA: Diagnosis not present

## 2020-08-11 DIAGNOSIS — Z9181 History of falling: Secondary | ICD-10-CM | POA: Diagnosis not present

## 2020-08-11 DIAGNOSIS — H548 Legal blindness, as defined in USA: Secondary | ICD-10-CM | POA: Diagnosis not present

## 2020-08-11 DIAGNOSIS — N400 Enlarged prostate without lower urinary tract symptoms: Secondary | ICD-10-CM | POA: Diagnosis not present

## 2020-08-11 DIAGNOSIS — G4700 Insomnia, unspecified: Secondary | ICD-10-CM | POA: Diagnosis not present

## 2020-08-11 DIAGNOSIS — Z79899 Other long term (current) drug therapy: Secondary | ICD-10-CM | POA: Diagnosis not present

## 2020-08-23 DIAGNOSIS — F331 Major depressive disorder, recurrent, moderate: Secondary | ICD-10-CM | POA: Diagnosis not present

## 2020-08-23 DIAGNOSIS — R351 Nocturia: Secondary | ICD-10-CM | POA: Diagnosis not present

## 2020-08-23 DIAGNOSIS — G4700 Insomnia, unspecified: Secondary | ICD-10-CM | POA: Diagnosis not present

## 2020-08-23 DIAGNOSIS — R05 Cough: Secondary | ICD-10-CM | POA: Diagnosis not present

## 2020-08-23 DIAGNOSIS — Z681 Body mass index (BMI) 19 or less, adult: Secondary | ICD-10-CM | POA: Diagnosis not present

## 2020-08-23 DIAGNOSIS — R630 Anorexia: Secondary | ICD-10-CM | POA: Diagnosis not present

## 2020-09-06 DIAGNOSIS — J209 Acute bronchitis, unspecified: Secondary | ICD-10-CM | POA: Diagnosis not present

## 2020-09-06 DIAGNOSIS — F331 Major depressive disorder, recurrent, moderate: Secondary | ICD-10-CM | POA: Diagnosis not present

## 2020-09-06 DIAGNOSIS — J449 Chronic obstructive pulmonary disease, unspecified: Secondary | ICD-10-CM | POA: Diagnosis not present

## 2020-10-19 DIAGNOSIS — Z681 Body mass index (BMI) 19 or less, adult: Secondary | ICD-10-CM | POA: Diagnosis not present

## 2020-10-19 DIAGNOSIS — J449 Chronic obstructive pulmonary disease, unspecified: Secondary | ICD-10-CM | POA: Diagnosis not present

## 2020-10-19 DIAGNOSIS — N400 Enlarged prostate without lower urinary tract symptoms: Secondary | ICD-10-CM | POA: Diagnosis not present

## 2020-10-19 DIAGNOSIS — K219 Gastro-esophageal reflux disease without esophagitis: Secondary | ICD-10-CM | POA: Diagnosis not present

## 2020-10-19 DIAGNOSIS — H353121 Nonexudative age-related macular degeneration, left eye, early dry stage: Secondary | ICD-10-CM | POA: Diagnosis not present

## 2020-10-19 DIAGNOSIS — H25812 Combined forms of age-related cataract, left eye: Secondary | ICD-10-CM | POA: Diagnosis not present

## 2020-10-19 DIAGNOSIS — Z23 Encounter for immunization: Secondary | ICD-10-CM | POA: Diagnosis not present

## 2020-10-26 DIAGNOSIS — R0789 Other chest pain: Secondary | ICD-10-CM | POA: Diagnosis not present

## 2020-10-26 DIAGNOSIS — J449 Chronic obstructive pulmonary disease, unspecified: Secondary | ICD-10-CM | POA: Diagnosis not present

## 2020-10-26 DIAGNOSIS — R269 Unspecified abnormalities of gait and mobility: Secondary | ICD-10-CM | POA: Diagnosis not present

## 2020-10-26 DIAGNOSIS — K219 Gastro-esophageal reflux disease without esophagitis: Secondary | ICD-10-CM | POA: Diagnosis not present

## 2020-11-09 DIAGNOSIS — H2512 Age-related nuclear cataract, left eye: Secondary | ICD-10-CM | POA: Diagnosis not present

## 2020-11-09 DIAGNOSIS — Z01818 Encounter for other preprocedural examination: Secondary | ICD-10-CM | POA: Diagnosis not present

## 2020-11-13 ENCOUNTER — Ambulatory Visit: Payer: PPO | Admitting: Sports Medicine

## 2020-11-13 DIAGNOSIS — Z7982 Long term (current) use of aspirin: Secondary | ICD-10-CM | POA: Diagnosis not present

## 2020-11-13 DIAGNOSIS — K219 Gastro-esophageal reflux disease without esophagitis: Secondary | ICD-10-CM | POA: Diagnosis not present

## 2020-11-13 DIAGNOSIS — H548 Legal blindness, as defined in USA: Secondary | ICD-10-CM | POA: Diagnosis not present

## 2020-11-13 DIAGNOSIS — E785 Hyperlipidemia, unspecified: Secondary | ICD-10-CM | POA: Diagnosis not present

## 2020-11-13 DIAGNOSIS — I10 Essential (primary) hypertension: Secondary | ICD-10-CM | POA: Diagnosis not present

## 2020-11-13 DIAGNOSIS — Z8673 Personal history of transient ischemic attack (TIA), and cerebral infarction without residual deficits: Secondary | ICD-10-CM | POA: Diagnosis not present

## 2020-11-13 DIAGNOSIS — Z79899 Other long term (current) drug therapy: Secondary | ICD-10-CM | POA: Diagnosis not present

## 2020-11-13 DIAGNOSIS — J449 Chronic obstructive pulmonary disease, unspecified: Secondary | ICD-10-CM | POA: Diagnosis not present

## 2020-11-13 DIAGNOSIS — H9190 Unspecified hearing loss, unspecified ear: Secondary | ICD-10-CM | POA: Diagnosis not present

## 2020-11-13 DIAGNOSIS — Z87891 Personal history of nicotine dependence: Secondary | ICD-10-CM | POA: Diagnosis not present

## 2020-11-13 DIAGNOSIS — H25812 Combined forms of age-related cataract, left eye: Secondary | ICD-10-CM | POA: Diagnosis not present

## 2020-11-13 DIAGNOSIS — H259 Unspecified age-related cataract: Secondary | ICD-10-CM | POA: Diagnosis not present

## 2020-11-20 DIAGNOSIS — Z23 Encounter for immunization: Secondary | ICD-10-CM | POA: Diagnosis not present

## 2021-01-18 DIAGNOSIS — S41102A Unspecified open wound of left upper arm, initial encounter: Secondary | ICD-10-CM | POA: Diagnosis not present

## 2021-02-18 DIAGNOSIS — J449 Chronic obstructive pulmonary disease, unspecified: Secondary | ICD-10-CM | POA: Diagnosis not present

## 2021-02-18 DIAGNOSIS — N289 Disorder of kidney and ureter, unspecified: Secondary | ICD-10-CM | POA: Diagnosis not present

## 2021-02-18 DIAGNOSIS — E559 Vitamin D deficiency, unspecified: Secondary | ICD-10-CM | POA: Diagnosis not present

## 2021-03-06 DIAGNOSIS — I951 Orthostatic hypotension: Secondary | ICD-10-CM | POA: Diagnosis not present

## 2021-03-06 DIAGNOSIS — J449 Chronic obstructive pulmonary disease, unspecified: Secondary | ICD-10-CM | POA: Diagnosis not present

## 2021-03-06 DIAGNOSIS — F331 Major depressive disorder, recurrent, moderate: Secondary | ICD-10-CM | POA: Diagnosis not present

## 2021-03-06 DIAGNOSIS — Z681 Body mass index (BMI) 19 or less, adult: Secondary | ICD-10-CM | POA: Diagnosis not present

## 2021-03-06 DIAGNOSIS — J302 Other seasonal allergic rhinitis: Secondary | ICD-10-CM | POA: Diagnosis not present

## 2021-03-06 DIAGNOSIS — R5383 Other fatigue: Secondary | ICD-10-CM | POA: Diagnosis not present

## 2021-03-06 DIAGNOSIS — E559 Vitamin D deficiency, unspecified: Secondary | ICD-10-CM | POA: Diagnosis not present

## 2021-03-15 DIAGNOSIS — N289 Disorder of kidney and ureter, unspecified: Secondary | ICD-10-CM | POA: Diagnosis not present

## 2021-03-15 DIAGNOSIS — F331 Major depressive disorder, recurrent, moderate: Secondary | ICD-10-CM | POA: Diagnosis not present

## 2021-03-15 DIAGNOSIS — Z681 Body mass index (BMI) 19 or less, adult: Secondary | ICD-10-CM | POA: Diagnosis not present

## 2021-03-15 DIAGNOSIS — S0181XA Laceration without foreign body of other part of head, initial encounter: Secondary | ICD-10-CM | POA: Diagnosis not present

## 2021-03-15 DIAGNOSIS — J449 Chronic obstructive pulmonary disease, unspecified: Secondary | ICD-10-CM | POA: Diagnosis not present

## 2021-03-15 DIAGNOSIS — I951 Orthostatic hypotension: Secondary | ICD-10-CM | POA: Diagnosis not present

## 2021-03-15 DIAGNOSIS — N400 Enlarged prostate without lower urinary tract symptoms: Secondary | ICD-10-CM | POA: Diagnosis not present

## 2021-03-19 DIAGNOSIS — R338 Other retention of urine: Secondary | ICD-10-CM | POA: Diagnosis not present

## 2021-03-19 DIAGNOSIS — K5909 Other constipation: Secondary | ICD-10-CM | POA: Diagnosis not present

## 2021-03-19 DIAGNOSIS — Z79899 Other long term (current) drug therapy: Secondary | ICD-10-CM | POA: Diagnosis not present

## 2021-03-19 DIAGNOSIS — N401 Enlarged prostate with lower urinary tract symptoms: Secondary | ICD-10-CM | POA: Diagnosis not present

## 2021-03-20 ENCOUNTER — Encounter: Payer: Self-pay | Admitting: Cardiology

## 2021-03-20 ENCOUNTER — Encounter: Payer: Self-pay | Admitting: *Deleted

## 2021-03-27 DIAGNOSIS — N401 Enlarged prostate with lower urinary tract symptoms: Secondary | ICD-10-CM | POA: Diagnosis not present

## 2021-03-27 DIAGNOSIS — R338 Other retention of urine: Secondary | ICD-10-CM | POA: Diagnosis not present

## 2021-03-27 DIAGNOSIS — K5909 Other constipation: Secondary | ICD-10-CM | POA: Diagnosis not present

## 2021-03-28 DIAGNOSIS — E7800 Pure hypercholesterolemia, unspecified: Secondary | ICD-10-CM | POA: Insufficient documentation

## 2021-03-28 DIAGNOSIS — M5030 Other cervical disc degeneration, unspecified cervical region: Secondary | ICD-10-CM | POA: Insufficient documentation

## 2021-03-28 DIAGNOSIS — I2510 Atherosclerotic heart disease of native coronary artery without angina pectoris: Secondary | ICD-10-CM | POA: Insufficient documentation

## 2021-03-28 DIAGNOSIS — G4700 Insomnia, unspecified: Secondary | ICD-10-CM | POA: Insufficient documentation

## 2021-03-28 DIAGNOSIS — N400 Enlarged prostate without lower urinary tract symptoms: Secondary | ICD-10-CM | POA: Insufficient documentation

## 2021-03-28 DIAGNOSIS — J329 Chronic sinusitis, unspecified: Secondary | ICD-10-CM | POA: Insufficient documentation

## 2021-04-01 ENCOUNTER — Ambulatory Visit: Payer: PPO | Admitting: Cardiology

## 2021-04-02 ENCOUNTER — Other Ambulatory Visit: Payer: Self-pay

## 2021-04-02 ENCOUNTER — Encounter: Payer: Self-pay | Admitting: Cardiology

## 2021-04-02 ENCOUNTER — Ambulatory Visit: Payer: PPO | Admitting: Cardiology

## 2021-04-02 VITALS — BP 110/62 | HR 92 | Ht 69.0 in | Wt 114.0 lb

## 2021-04-02 DIAGNOSIS — R54 Age-related physical debility: Secondary | ICD-10-CM

## 2021-04-02 DIAGNOSIS — I719 Aortic aneurysm of unspecified site, without rupture: Secondary | ICD-10-CM | POA: Diagnosis not present

## 2021-04-02 DIAGNOSIS — E46 Unspecified protein-calorie malnutrition: Secondary | ICD-10-CM

## 2021-04-02 DIAGNOSIS — I951 Orthostatic hypotension: Secondary | ICD-10-CM | POA: Diagnosis not present

## 2021-04-02 DIAGNOSIS — R64 Cachexia: Secondary | ICD-10-CM | POA: Diagnosis not present

## 2021-04-02 NOTE — Patient Instructions (Signed)

## 2021-04-02 NOTE — Progress Notes (Signed)
Cardiology Office Note:    Date:  04/02/2021   ID:  [name], DOB 08/17/1928, MRN [id number]  PCP:  Angelina Sheriff, MD  Cardiologist:  No primary care provider on file.  Electrophysiologist:  None   Referring MD: Angelina Sheriff, MD   Dr. Lin Landsman wanted me to get an assessment  History of Present Illness:    David Figueroa is a 87 y.o. male with a hx of aortic dilatation 4.2 ascending aortic aneurysm, mixed hyperlipidemia, history of CVA, former smoker, left subclavian artery stenosis.  The patient his wife is here today at the request of his primary care provider.  She tells me that he followed with [facility] and was last seen there in 2019 which I was able to review this note in the chart.  The patient is a poor historian but his wife is giving some history.  Reports that at that visit in 2019 the patient was discharged from cardiology care as he had decided that he will not pursue any surgical attention for his aortic aneurysm.  In that April 2019 visit he was status post a nuclear stress test which was normal.  She tells me that his PCP would like him to be evaluated before to decide if he will be going to a nursing home or not.  Today he denies any chest pain, shortness of breath, lightheadedness or dizziness.  According to his wife the patient has had significant decline in his health he has palliative care with nursing attention coming to their home weekly.  Past Medical History:  Diagnosis Date  . Chronic respiratory failure with hypoxia ([place]) 03/11/2018  . COPD (chronic obstructive pulmonary disease) ([place])   . Coronary atherosclerosis   . DDD (degenerative disc disease), cervical   . DOE (dyspnea on exertion) 02/19/2018  . Former smoker 02/19/2018  . Hypercholesterolemia   . Insomnia   . Long-term use of aspirin therapy 02/18/2018  . Mixed hyperlipidemia 02/18/2018  . Old cerebrovascular accident (CVA) without late effect 02/18/2018  . PAD (peripheral  artery disease) ([place]) 05/29/2020  . Poor historian 02/19/2018  . Prostatic hypertrophy   . Retinal hemorrhage 07/18/2013   Left/ essentially blind OS  . Sinusitis   . Stenosis of left subclavian artery ([place]) 02/19/2018  . Thoracic aortic aneurysm without mention of rupture 03/30/2013  . Ventricular tachyarrhythmia ([place]) 02/19/2018  . Vocal cord paralysis     Past Surgical History:  Procedure Laterality Date  . HIP FRACTURE SURGERY Right 07/31/2019   Using Gamma Nail, performed by Dr. Donivan Scull at [facility]    Current Medications: Current Meds  Medication Sig  . albuterol (VENTOLIN HFA) 108 (90 Base) MCG/ACT inhaler Inhale 2 puffs into the lungs every 6 (six) hours as needed for wheezing or shortness of breath.  Marland Kitchen atorvastatin (LIPITOR) 40 MG tablet Take 40 mg by mouth at bedtime.  . bethanechol (URECHOLINE) 25 MG tablet Take 25 mg by mouth in the morning and at bedtime.  Marland Kitchen buPROPion (WELLBUTRIN XL) 150 MG 24 hr tablet Take 1 tablet by mouth daily with supper.  . citalopram (CELEXA) 20 MG tablet Take 20 mg by mouth daily.  . clopidogrel (PLAVIX) 75 MG tablet Take 75 mg by mouth daily.  . Cyanocobalamin (B-12 COMPLIANCE INJECTION IJ) Inject 1,000 mcg as directed every 30 (thirty) days.  Marland Kitchen dicyclomine (BENTYL) 20 MG tablet Take 20 mg by mouth 4 (four) times daily -  before meals and at bedtime.  Marland Kitchen  famotidine (PEPCID) 40 MG tablet Take 40 mg by mouth at bedtime.  . finasteride (PROSCAR) 5 MG tablet Take 5 mg by mouth daily.  . fluticasone (FLONASE) 50 MCG/ACT nasal spray Place 2 sprays into both nostrils daily.  . Fluticasone-Salmeterol (ADVAIR) 100-50 MCG/DOSE AEPB Inhale 1 puff into the lungs every 12 (twelve) hours.  . Melatonin 10 MG TABS Take 20 mg by mouth at bedtime as needed (Sleep).  . mirtazapine (REMERON) 30 MG tablet Take 30 mg by mouth at bedtime.  . Multiple Vitamins-Minerals (MULTIVITAMIN PO) Take by mouth.  . [DISCONTINUED] tamsulosin (FLOMAX) 0.4 MG CAPS capsule Take 0.4 mg  by mouth at bedtime.     Allergies:   Varenicline tartrate   Social History   Socioeconomic History  . Marital status: Married    Spouse name: Not on file  . Number of children: Not on file  . Years of education: Not on file  . Highest education level: Not on file  Occupational History  . Not on file  Tobacco Use  . Smoking status: Former Smoker    Packs/day: 0.75    Years: 50.00    Pack years: 37.50    Types: Cigarettes, Pipe    Quit date: 03/30/2010    Years since quitting: 11.0  . Smokeless tobacco: Never Used  Substance and Sexual Activity  . Alcohol use: Not Currently  . Drug use: Not on file  . Sexual activity: Not on file  Other Topics Concern  . Not on file  Social History Narrative  . Not on file   Social Determinants of Health   Financial Resource Strain: Not on file  Food Insecurity: Not on file  Transportation Needs: Not on file  Physical Activity: Not on file  Stress: Not on file  Social Connections: Not on file     Family History: The patient's family history includes Heart disease in his brother and father; Other in his brother.  ROS:   Review of Systems  Constitution: Negative for decreased appetite, fever and weight gain.  HENT: Negative for congestion, ear discharge, hoarse voice and sore throat.   Eyes: Negative for discharge, redness, vision loss in right eye and visual halos.  Cardiovascular: Negative for chest pain, dyspnea on exertion, leg swelling, orthopnea and palpitations.  Respiratory: Negative for cough, hemoptysis, shortness of breath and snoring.   Endocrine: Negative for heat intolerance and polyphagia.  Hematologic/Lymphatic: Negative for bleeding problem. Does not bruise/bleed easily.  Skin: Negative for flushing, nail changes, rash and suspicious lesions.  Musculoskeletal: Negative for arthritis, joint pain, muscle cramps, myalgias, neck pain and stiffness.  Gastrointestinal: Negative for abdominal pain, bowel incontinence,  diarrhea and excessive appetite.  Genitourinary: Negative for decreased libido, genital sores and incomplete emptying.  Neurological: Negative for brief paralysis, focal weakness, headaches and loss of balance.  Psychiatric/Behavioral: Negative for altered mental status, depression and suicidal ideas.  Allergic/Immunologic: Negative for HIV exposure and persistent infections.    EKGs/Labs/Other Studies Reviewed:    The following studies were reviewed today:   EKG:  The ekg ordered today demonstrates sinus rhythm, heart rate 92 bpm with wandering baseline  Recent Labs: No results found for requested labs within last 8760 hours.  Recent Lipid Panel No results found for: CHOL, TRIG, HDL, CHOLHDL, VLDL, LDLCALC, LDLDIRECT  Physical Exam:    VS:  BP 110/62 (BP Location: Right Arm)   Pulse 92   Ht 5\' 9"  (1.753 m)   Wt 114 lb (51.7 kg)   SpO2 Marland Kitchen)  76%   BMI 16.83 kg/m     Wt Readings from Last 3 Encounters:  04/02/21 114 lb (51.7 kg)  03/06/21 117 lb (53.1 kg)  05/29/20 130 lb (59 kg)     GEN: Very frail male with well nourished, well developed in no acute distress HEENT: Normal NECK: No JVD; No carotid bruits LYMPHATICS: No lymphadenopathy CARDIAC: S1S2 noted,RRR, no murmurs, rubs, gallops RESPIRATORY:  Clear to auscultation without rales, wheezing or rhonchi  ABDOMEN: Soft, non-tender, non-distended, +bowel sounds, no guarding. EXTREMITIES: No edema, No cyanosis, no clubbing MUSCULOSKELETAL:  No deformity  SKIN: Warm and dry NEUROLOGIC:  Alert and oriented x 3, non-focal PSYCHIATRIC:  Normal affect, good insight  ASSESSMENT:    1. Orthostatic hypotension   2. Aortic aneurysm without rupture, unspecified portion of aorta ([place])   3. Frailty   4. Cachectic ([place])   5. Protein-calorie malnutrition, unspecified severity ([place])    PLAN:    His systolic blood pressures in the low 100s here.  Based on his PCP notes he had been started on midodrine 2.5 mg every 8 hours but  the patient had not actually taken his medication.  I do believe that his poor p.o. intake as well as low fluid intake is contributing orthostasis.  We talked about his ascending aneurysm and his wife reported that they had decided not to pursue surgical intervention at that time which I do not think is unreasonable.  And now he is not a candidate given his significant family may not be able to tolerate any intervention.  His last MRI was in 2019 showed stable 4.2 cm aneurysm.  He is on palliative care which I think is the best decision for this patient.  The patient is in agreement with the above plan. The patient left the office in stable condition.  The patient will follow up in 1 year   Medication Adjustments/Labs and Tests Ordered: Current medicines are reviewed at length with the patient today.  Concerns regarding medicines are outlined above.  Orders Placed This Encounter  Procedures  . EKG 12-Lead   No orders of the defined types were placed in this encounter.   Patient Instructions  Medication Instructions:  Your physician recommends that you continue on your current medications as directed. Please refer to the Current Medication list given to you today. *If you need a refill on your cardiac medications before your next appointment, please call your pharmacy*   Lab Work: None If you have labs (blood work) drawn today and your tests are completely normal, you will receive your results only by: Marland Kitchen MyChart Message (if you have MyChart) OR . A paper copy in the mail If you have any lab test that is abnormal or we need to change your treatment, we will call you to review the results.   Testing/Procedures: None   Follow-Up: At [facility], you and your health needs are our priority.  As part of our continuing mission to provide you with exceptional heart care, we have created designated Provider Care Teams.  These Care Teams include your primary Cardiologist (physician) and  Advanced Practice Providers (APPs -  Physician Assistants and Nurse Practitioners) who all work together to provide you with the care you need, when you need it.  We recommend signing up for the patient portal called "MyChart".  Sign up information is provided on this After Visit Summary.  MyChart is used to connect with patients for Virtual Visits (Telemedicine).  Patients are able to view lab/test  results, encounter notes, upcoming appointments, etc.  Non-urgent messages can be sent to your provider as well.   To learn more about what you can do with MyChart, go to [email or web address].    Your next appointment:   1 year(s)  The format for your next appointment:   In Person  Provider:   Berniece Salines, DO   Other Instructions      Adopting a Healthy Lifestyle.  Know what a healthy weight is for you (roughly BMI <25) and aim to maintain this   Aim for 7+ servings of fruits and vegetables daily   65-80+ fluid ounces of water or unsweet tea for healthy kidneys   Limit to max 1 drink of alcohol per day; avoid smoking/tobacco   Limit animal fats in diet for cholesterol and heart health - choose grass fed whenever available   Avoid highly processed foods, and foods high in saturated/trans fats   Aim for low stress - take time to unwind and care for your mental health   Aim for 150 min of moderate intensity exercise weekly for heart health, and weights twice weekly for bone health   Aim for 7-9 hours of sleep daily   When it comes to diets, agreement about the perfect plan isnt easy to find, even among the experts. Experts at the [employer] developed an idea known as the Healthy Eating Plate. Just imagine a plate divided into logical, healthy portions.   The emphasis is on diet quality:   Load up on vegetables and fruits - one-half of your plate: Aim for color and variety, and remember that potatoes dont count.   Go for whole grains - one-quarter of  your plate: Whole wheat, barley, wheat berries, quinoa, oats, brown rice, and foods made with them. If you want pasta, go with whole wheat pasta.   Protein power - one-quarter of your plate: Fish, chicken, beans, and nuts are all healthy, versatile protein sources. Limit red meat.   The diet, however, does go beyond the plate, offering a few other suggestions.   Use healthy plant oils, such as olive, canola, soy, corn, sunflower and peanut. Check the labels, and avoid partially hydrogenated oil, which have unhealthy trans fats.   If youre thirsty, drink water. Coffee and tea are good in moderation, but skip sugary drinks and limit milk and dairy products to one or two daily servings.   The type of carbohydrate in the diet is more important than the amount. Some sources of carbohydrates, such as vegetables, fruits, whole grains, and beans-are healthier than others.   Finally, stay active  Signed, Berniece Salines, DO  04/02/2021 1:14 PM    [facility]

## 2021-04-08 DIAGNOSIS — J449 Chronic obstructive pulmonary disease, unspecified: Secondary | ICD-10-CM | POA: Diagnosis not present

## 2021-04-20 DIAGNOSIS — J449 Chronic obstructive pulmonary disease, unspecified: Secondary | ICD-10-CM | POA: Diagnosis not present

## 2021-04-20 DIAGNOSIS — D510 Vitamin B12 deficiency anemia due to intrinsic factor deficiency: Secondary | ICD-10-CM | POA: Diagnosis not present

## 2021-04-22 DIAGNOSIS — G311 Senile degeneration of brain, not elsewhere classified: Secondary | ICD-10-CM | POA: Diagnosis not present

## 2021-05-22 DEATH — deceased
